# Patient Record
Sex: Female | Born: 1935 | Hispanic: No | Marital: Married | Smoking: Current every day smoker
Health system: Southern US, Community
[De-identification: ages and names within clinical notes are randomized; demographics above are authoritative.]

## PROBLEM LIST (undated history)

## (undated) DIAGNOSIS — N390 Urinary tract infection, site not specified: Secondary | ICD-10-CM

## (undated) DIAGNOSIS — E7800 Pure hypercholesterolemia, unspecified: Secondary | ICD-10-CM

## (undated) DIAGNOSIS — K259 Gastric ulcer, unspecified as acute or chronic, without hemorrhage or perforation: Secondary | ICD-10-CM

## (undated) DIAGNOSIS — I10 Essential (primary) hypertension: Secondary | ICD-10-CM

## (undated) DIAGNOSIS — Z9289 Personal history of other medical treatment: Secondary | ICD-10-CM

## (undated) DIAGNOSIS — I517 Cardiomegaly: Secondary | ICD-10-CM

## (undated) HISTORY — PX: NO PAST SURGERIES: SHX2092

## (undated) HISTORY — PX: ESOPHAGOGASTRODUODENOSCOPY ENDOSCOPY: SHX5814

---

## 1998-01-04 ENCOUNTER — Ambulatory Visit (HOSPITAL_COMMUNITY): Admission: RE | Admit: 1998-01-04 | Discharge: 1998-01-04 | Payer: Self-pay | Admitting: Family Medicine

## 1999-01-31 ENCOUNTER — Ambulatory Visit (HOSPITAL_COMMUNITY): Admission: RE | Admit: 1999-01-31 | Discharge: 1999-01-31 | Payer: Self-pay | Admitting: Family Medicine

## 2000-02-01 ENCOUNTER — Encounter: Payer: Self-pay | Admitting: Family Medicine

## 2000-02-01 ENCOUNTER — Ambulatory Visit (HOSPITAL_COMMUNITY): Admission: RE | Admit: 2000-02-01 | Discharge: 2000-02-01 | Payer: Self-pay | Admitting: Family Medicine

## 2007-09-16 ENCOUNTER — Inpatient Hospital Stay (HOSPITAL_COMMUNITY): Admission: EM | Admit: 2007-09-16 | Discharge: 2007-09-19 | Payer: Self-pay | Admitting: Emergency Medicine

## 2007-09-16 ENCOUNTER — Ambulatory Visit: Payer: Self-pay | Admitting: Family Medicine

## 2011-03-06 NOTE — Discharge Summary (Signed)
[name], [name] NO.:  [id number]   MEDICAL RECORD NO.:  [id number]          PATIENT TYPE:  INP   LOCATION:  4729                         FACILITY:  [facility]   PHYSICIAN:  Nestor Ramp, MD        DATE OF BIRTH:  [birth year]   DATE OF ADMISSION:  09/16/2007  DATE OF DISCHARGE:  09/19/2007                               DISCHARGE SUMMARY   PRIMARY CARE PHYSICIAN:  Dr. Windle Guard at [facility].   REASON FOR ADMISSION:  Weakness.   DISCHARGE DIAGNOSES:  1. Pyelonephritis, bilaterally.  2. Hyponatremia.  3. Anemia.  4. Hypertension.  5. Hyperlipidemia.  6. Thyroid nodule.   DISCHARGE MEDICATIONS:  1. Lipitor 10 mg p.o. daily.  2. Atenolol 100 mg p.o. daily.  3. Ciprofloxacin 500 mg 1 tablet p.o. b.i.d. x7 days.   Patient is to stop taking potassium and hydrochlorothiazide until she is  seen by Dr. Jeannetta Nap.  These medications were stopped because patient had  a normal potassium level on the day of discharge and continued  hyponatremia, which may be exacerbated by hydrochlorothiazide.   HOSPITAL COURSE:  Patient is a 77 year old female who presented with a  12-day history of nausea, vomiting and progressive weakness, found to  have nausea, vomiting and progressive weakness, found to have evidence  of urinary tract infection on urinalysis, as well as tachycardia and  hypotension.  Patient also had right CVA tenderness.   1. Pyelonephritis:  After obtaining urinalysis and urine culture,      patient was started on Rocephin 1 gram IV q.24 hours.  A CT scan of      the chest, abdomen and pelvis showed no evidence of abscess or      obstruction.  Patient's urine culture grew out E. Coli, which was      penicillin-sensitive and she was transitioned to Ciprofloxacin 500      mg p.o. b.i.d. prior to discharge.  She will need to complete a 10-      day course of antibiotics for her pyelonephritis.  By the day of      discharge, her vital signs  were stable.  She had been afebrile      greater than 24 hours and she had clinically improved.  Patient had      required brief placement of a Foley secondary to a postvoid      residual of 900 mL; however, she successfully passed a voiding      trial on the day prior to discharge and did not require any further      catheterization.  2. Anemia:  On admission, patient was found to have a hemoglobin of      8.9.  She was thought to be very hemoconcentrated secondary to      dehydration and so with fluid resuscitation, her repeat CBC was      significant for a hemoglobin of 6.2.  Patient was typed and crossed      for 2 units of blood.  She received the first unit without  any      difficulty; however, with the second unit she had an elevated      temperature and 19 beats of asymptomatic ventricular tachycardiac.      The second transfusion was stopped and she did not have any further      complications.  We were unable to complete iron studies of      significance secondary to patient receiving the blood transfusion      prior to those studies being drawn.  Patient's hemoglobin      stabilized at 8.9 prior to discharge.  She will need further workup      of this problem as an outpatient.  We recommend obtaining normal      anemia panel studies, as well as colonoscopy as an outpatient.  3. Hyponatremia:  Upon presentation, patient had a sodium level of      123.  Throughout the course of her hospital stay, this did trend up      slightly with administration of normal saline.  However, it never      reached a normal level and was 128 on the day of discharge.      Patient is asymptomatic.  Therefore, continued observation of this      problem can be observed as an outpatient.  Her HCTZ is being held      upon discharge; it was never started while in the hospital.  We      will leave restarting this medication up to the discretion of Dr.      Jeannetta Nap.  4. Hypertension:  Initially, patient was  hypotensive in the hospital.      However, with volume resuscitation her blood pressures normalized.      With her episode of ventricular tachycardia, her Atenolol was      restarted.  Patient remained normotensive throughout the duration      of her hospital stay.  Again, her HCTZ is being held secondary to      her hyponatremia.  It may be restarted by Dr. Jeannetta Nap at his      discretion.  5. Hypophosphatemia:  After patient's episode of ventricular      tachycardia, she received multiple laboratory studies, including      the phosphorus level.  This was found to be low at 1.4.  Patient      was given K-Phos 250 mg p.o. a.c. and h.s. to treat her hospital      phosphatemia.   CONDITION ON DISCHARGE:  Stable.   DISCHARGE LABS:  White blood cell count 16.4, hemoglobin 8.9, hematocrit  26.3, MCV 90.2, platelets 330, sodium 128, potassium 3.5, glucose 103,  BUN 4, creatinine 0.58, calcium 7.6, albumin 2.3, phosphorus 1.4,  magnesium 1.7.   DISPOSITION:  Patient is discharged to home.   DISCHARGE FOLLOW UP:  Patient is to follow up with Dr. Jeannetta Nap early next  week.  Patient will need to call and schedule an appointment.   FOLLOW UP ISSUES:  1. Hyponatremia.  2. Hypophosphatemia.  3. Anemia.  4. On CT scan, the patient was found to have a tiny left thyroid      nodule and thyroid ultrasound was recommended.      Lauro Franklin, MD  Electronically Signed      Nestor Ramp, MD  Electronically Signed    TCB/MEDQ  D:  09/19/2007  T:  09/19/2007  Job:  [id number]

## 2011-03-06 NOTE — H&P (Signed)
[name], [name] NO.:  [id number]   MEDICAL RECORD NO.:  [id number]          PATIENT TYPE:  INP   LOCATION:  4729                         FACILITY:  [facility]   PHYSICIAN:  Nestor Ramp, MD        DATE OF BIRTH:  1930/08/18   DATE OF ADMISSION:  09/16/2007  DATE OF DISCHARGE:                              HISTORY & PHYSICAL   PRIMARY CARE PHYSICIAN:  Dr. Jeannetta Martin at [facility].   CHIEF COMPLAINT:  Weakness.   HISTORY OF PRESENT ILLNESS:  This is a 77 year old [place] female with  a history of hypertension and hyperlipidemia.  Her husband reports that  she started 12 days ago with a sinus headache.  She has had some  intermittent nausea and vomiting for a period that lasted approximately  3 to 4 days.  She was seen by her primary care physician last week and  prescribed Phenergan as needed for nausea.  She says that she has taken  Phenergan approximately 3 to 4 times during the past week.  She has not  vomited now for about 6 days.  She is currently tolerating oral intake;  however, her husband reports progressive weakness.  She was so weak this  morning that she was unable to stand up.  In the emergency department,  she was tachycardic, slightly hypotensive and there was evidence of a  UTI on her urinalysis.   REVIEW OF SYSTEMS:  Pertinent for low back pain.  The patient denies  fevers, chills, dysuria, abdominal pain and headache.  The rest of the  review of systems is per HPI.   PAST MEDICAL HISTORY:  1. Hypertension,  2. Hyperlipidemia.   PAST SURGICAL HISTORY:  The patient has no history of any surgeries.   SOCIAL HISTORY:  She is originally from [place].  She has been married, to  her husband, for over 40 years.  She currently has 2 grown children and  several grandchildren.  She admits to occasional tobacco use, but denies  any regular routine alcohol use.   FAMILY HISTORY:  Not contributory in this 77 year old female.   PHYSICAL  EXAMINATION:  VITAL SIGNS:  Temperature 98.3.  Blood pressure  106/64.  Heart rate 104.  Respirations 20.  GENERAL:  The patient is in no acute distress.  She is laying on the  stretcher.  She is alert and oriented.  NECK:  There was no JVD.  No thyromegaly.  HEENT:  Mucous membranes are very mildly dry.  Pharynx is not  erythematous.  CARDIOVASCULAR:  Regular rate and rhythm without murmur.  LUNGS:  Clear to auscultation bilaterally.  Normal respiratory effort.  ABDOMEN:  Bowel sounds are active.  Abdomen is soft, nontender,  nondistended.  There are no masses and there is right costovertebral  angle tenderness.  EXTREMITIES:  Warm and well perfuse.  There is no edema.   LABORATORY DATA:  Sodium 123, potassium 2.7, chloride 83, bicarb 28,  creatinine 0.77, BUN 30, glucose 163.  Liver enzymes are normal.  Calcium is 7.9 and albumin is 2.6.  The patient's white count is 26, her  hemoglobin is 8.9, platelets are 361.  Urinalysis is significant for a  moderate leukocyte esterase, nitrites are negative.  There are 21 to 50  white blood cells and many bacteria on microscopic analysis.   ASSESSMENT:  This is a 77 year old female with a history of hypertension  and hyperlipidemia.  She presents with a 12 day history of intermittent  nausea, vomiting and progressive weakness.  She has evidence of  pyelonephritis, as well as volume depletion and hyponatremia in the  emergency department.   1. Pyelonephritis:  We will continue treatment with Rocephin and      follow urine cultures as available.  2. Volume depletion:  She is status post 1 liter bolus x2 in the      emergency department.  We will continue normal sinus rhythm at 100      mL per hour.  3. Nausea and vomiting:  Likely secondary to pyelonephritis.  We will      begin Phenergan as needed.  4. Hyponatremia:  Hypovolemic.  We will watch the trend in sodium      after giving normal saline fluids.  5. Hypertension:  Her blood pressure  is now borderline.  We will hold      her home blood pressure medications for now.  6. Hyperlipidemia:  We will resume her home statin dose while in the      hospital.      Sylvan Cheese, M.D.  Electronically Signed      Nestor Ramp, MD  Electronically Signed    MJ/MEDQ  D:  09/16/2007  T:  09/17/2007  Job:  [id number]

## 2011-06-24 ENCOUNTER — Emergency Department (HOSPITAL_COMMUNITY): Payer: Medicare Other

## 2011-06-24 ENCOUNTER — Emergency Department (HOSPITAL_COMMUNITY)
Admission: EM | Admit: 2011-06-24 | Discharge: 2011-06-24 | Disposition: A | Discharge status: Home / Self Care | Payer: Medicare Other | Attending: Emergency Medicine | Admitting: Emergency Medicine

## 2011-06-24 DIAGNOSIS — I10 Essential (primary) hypertension: Secondary | ICD-10-CM | POA: Insufficient documentation

## 2011-06-24 DIAGNOSIS — R1013 Epigastric pain: Secondary | ICD-10-CM | POA: Insufficient documentation

## 2011-06-24 DIAGNOSIS — E860 Dehydration: Secondary | ICD-10-CM | POA: Insufficient documentation

## 2011-06-24 DIAGNOSIS — E876 Hypokalemia: Secondary | ICD-10-CM | POA: Insufficient documentation

## 2011-06-24 DIAGNOSIS — I4510 Unspecified right bundle-branch block: Secondary | ICD-10-CM | POA: Insufficient documentation

## 2011-06-24 DIAGNOSIS — R110 Nausea: Secondary | ICD-10-CM | POA: Insufficient documentation

## 2011-06-24 DIAGNOSIS — N390 Urinary tract infection, site not specified: Secondary | ICD-10-CM | POA: Insufficient documentation

## 2011-06-24 DIAGNOSIS — E7800 Pure hypercholesterolemia, unspecified: Secondary | ICD-10-CM | POA: Insufficient documentation

## 2011-06-24 LAB — DIFFERENTIAL
Eosinophils Relative: 0 % (ref 0–5)
Lymphocytes Relative: 9 % — ABNORMAL LOW (ref 12–46)
Monocytes Absolute: 0.8 10*3/uL (ref 0.1–1.0)
Monocytes Relative: 6 % (ref 3–12)
Neutro Abs: 11.3 10*3/uL — ABNORMAL HIGH (ref 1.7–7.7)

## 2011-06-24 LAB — CBC
HCT: 30.1 % — ABNORMAL LOW (ref 36.0–46.0)
Hemoglobin: 10.2 g/dL — ABNORMAL LOW (ref 12.0–15.0)
MCH: 28 pg (ref 26.0–34.0)
MCHC: 33.9 g/dL (ref 30.0–36.0)
RDW: 12.5 % (ref 11.5–15.5)

## 2011-06-24 LAB — URINALYSIS, ROUTINE W REFLEX MICROSCOPIC
Bilirubin Urine: NEGATIVE
Nitrite: POSITIVE — AB
Specific Gravity, Urine: 1.021 (ref 1.005–1.030)
Urobilinogen, UA: 0.2 mg/dL (ref 0.0–1.0)

## 2011-06-24 LAB — URINE MICROSCOPIC-ADD ON

## 2011-06-24 LAB — COMPREHENSIVE METABOLIC PANEL
BUN: 41 mg/dL — ABNORMAL HIGH (ref 6–23)
Calcium: 9.1 mg/dL (ref 8.4–10.5)
Creatinine, Ser: 0.98 mg/dL (ref 0.50–1.10)
GFR calc Af Amer: >60 mL/min (ref >60)
Glucose, Bld: 122 mg/dL — ABNORMAL HIGH (ref 70–99)
Total Protein: 7.6 g/dL (ref 6.0–8.3)

## 2011-06-24 LAB — GLUCOSE, CAPILLARY: Glucose-Capillary: 132 mg/dL — ABNORMAL HIGH (ref 70–99)

## 2011-06-24 LAB — LIPASE, BLOOD: Lipase: 28 U/L (ref 11–59)

## 2011-06-26 LAB — URINE CULTURE

## 2011-07-31 LAB — URINE MICROSCOPIC-ADD ON

## 2011-07-31 LAB — URINALYSIS, MICROSCOPIC ONLY
Glucose, UA: NEGATIVE
Nitrite: NEGATIVE
Protein, ur: NEGATIVE
Urobilinogen, UA: 1

## 2011-07-31 LAB — CROSSMATCH: ABO/RH(D): O POS

## 2011-07-31 LAB — COMPREHENSIVE METABOLIC PANEL
ALT: 35
AST: 25
Alkaline Phosphatase: 56
BUN: 4 — ABNORMAL LOW
CO2: 28
Calcium: 7.9 — ABNORMAL LOW
Chloride: 96
Creatinine, Ser: 0.58
GFR calc Af Amer: >60
GFR calc non Af Amer: >60
Glucose, Bld: 103 — ABNORMAL HIGH
Potassium: 3.5
Sodium: 123 — ABNORMAL LOW
Total Bilirubin: 0.9
Total Protein: 5.3 — ABNORMAL LOW
Total Protein: 5.9 — ABNORMAL LOW

## 2011-07-31 LAB — BASIC METABOLIC PANEL
BUN: 5 — ABNORMAL LOW
BUN: 6
CO2: 24
CO2: 25
Calcium: 7.6 — ABNORMAL LOW
Chloride: 96
Chloride: 97
Chloride: 97
Chloride: 99
Creatinine, Ser: 0.46
Creatinine, Ser: 0.48
Creatinine, Ser: 0.59
GFR calc Af Amer: >60
GFR calc Af Amer: >60
GFR calc Af Amer: >60
GFR calc non Af Amer: >60
Potassium: 3.2 — ABNORMAL LOW
Potassium: 3.8
Potassium: 3.9
Sodium: 130 — ABNORMAL LOW

## 2011-07-31 LAB — CBC
HCT: 18 — ABNORMAL LOW
HCT: 23.2 — ABNORMAL LOW
HCT: 26.3 — ABNORMAL LOW
Hemoglobin: 8 — ABNORMAL LOW
Hemoglobin: 8.9 — ABNORMAL LOW
MCHC: 33.9
MCHC: 34.3
MCHC: 34.7
MCV: 87.5
MCV: 88.1
MCV: 90.2
MCV: 90.7
Platelets: 257
Platelets: 269
Platelets: 330
RBC: 2.06 — ABNORMAL LOW
RBC: 2.73 — ABNORMAL LOW
RBC: 2.98 — ABNORMAL LOW
RDW: 13.3
RDW: 13.4
RDW: 13.5
WBC: 14.7 — ABNORMAL HIGH

## 2011-07-31 LAB — URINALYSIS, ROUTINE W REFLEX MICROSCOPIC
Nitrite: NEGATIVE
Specific Gravity, Urine: 1.014
pH: 5.5

## 2011-07-31 LAB — CULTURE, BLOOD (ROUTINE X 2)
Culture: NO GROWTH
Culture: NO GROWTH

## 2011-07-31 LAB — URINE CULTURE
Colony Count: NO GROWTH
Culture: NO GROWTH
Special Requests: POSITIVE

## 2011-07-31 LAB — MAGNESIUM: Magnesium: 1.7

## 2011-07-31 LAB — GRAM STAIN: Gram Stain: NONE SEEN

## 2011-07-31 LAB — IRON AND TIBC
Iron: 47
TIBC: 163 — ABNORMAL LOW

## 2011-07-31 LAB — ABO/RH: ABO/RH(D): O POS

## 2011-07-31 LAB — FERRITIN: Ferritin: 310 — ABNORMAL HIGH (ref 10–291)

## 2011-07-31 LAB — VITAMIN D 25 HYDROXY (VIT D DEFICIENCY, FRACTURES): Vit D, 25-Hydroxy: 32 (ref 30–89)

## 2011-07-31 LAB — POCT CARDIAC MARKERS: Troponin i, poc: <0.05

## 2013-02-17 ENCOUNTER — Other Ambulatory Visit: Payer: Self-pay

## 2013-02-17 ENCOUNTER — Observation Stay (HOSPITAL_COMMUNITY)
Admission: EM | Admit: 2013-02-17 | Discharge: 2013-02-18 | Disposition: A | Discharge status: Home / Self Care | Payer: Medicare Other | Attending: Internal Medicine | Admitting: Internal Medicine

## 2013-02-17 ENCOUNTER — Emergency Department (HOSPITAL_COMMUNITY): Payer: Medicare Other

## 2013-02-17 ENCOUNTER — Encounter (HOSPITAL_COMMUNITY): Payer: Self-pay | Admitting: *Deleted

## 2013-02-17 DIAGNOSIS — E785 Hyperlipidemia, unspecified: Secondary | ICD-10-CM | POA: Insufficient documentation

## 2013-02-17 DIAGNOSIS — I10 Essential (primary) hypertension: Secondary | ICD-10-CM

## 2013-02-17 DIAGNOSIS — R5381 Other malaise: Secondary | ICD-10-CM | POA: Insufficient documentation

## 2013-02-17 DIAGNOSIS — R5383 Other fatigue: Secondary | ICD-10-CM | POA: Insufficient documentation

## 2013-02-17 DIAGNOSIS — N289 Disorder of kidney and ureter, unspecified: Secondary | ICD-10-CM

## 2013-02-17 DIAGNOSIS — R112 Nausea with vomiting, unspecified: Secondary | ICD-10-CM | POA: Insufficient documentation

## 2013-02-17 DIAGNOSIS — E861 Hypovolemia: Secondary | ICD-10-CM | POA: Insufficient documentation

## 2013-02-17 DIAGNOSIS — E7800 Pure hypercholesterolemia, unspecified: Secondary | ICD-10-CM | POA: Insufficient documentation

## 2013-02-17 DIAGNOSIS — E876 Hypokalemia: Principal | ICD-10-CM

## 2013-02-17 DIAGNOSIS — R82998 Other abnormal findings in urine: Secondary | ICD-10-CM | POA: Insufficient documentation

## 2013-02-17 DIAGNOSIS — E782 Mixed hyperlipidemia: Secondary | ICD-10-CM

## 2013-02-17 DIAGNOSIS — E860 Dehydration: Secondary | ICD-10-CM

## 2013-02-17 DIAGNOSIS — R630 Anorexia: Secondary | ICD-10-CM | POA: Insufficient documentation

## 2013-02-17 DIAGNOSIS — E871 Hypo-osmolality and hyponatremia: Secondary | ICD-10-CM | POA: Insufficient documentation

## 2013-02-17 DIAGNOSIS — N390 Urinary tract infection, site not specified: Secondary | ICD-10-CM

## 2013-02-17 HISTORY — DX: Cardiomegaly: I51.7

## 2013-02-17 HISTORY — DX: Pure hypercholesterolemia, unspecified: E78.00

## 2013-02-17 HISTORY — DX: Essential (primary) hypertension: I10

## 2013-02-17 LAB — BASIC METABOLIC PANEL
BUN: 34 mg/dL — ABNORMAL HIGH (ref 6–23)
CO2: 29 mEq/L (ref 19–32)
CO2: 27 mEq/L (ref 19–32)
Chloride: 99 mEq/L (ref 96–112)
GFR calc Af Amer: 64 mL/min — ABNORMAL LOW (ref >90)
GFR calc non Af Amer: 53 mL/min — ABNORMAL LOW (ref >90)
GFR calc non Af Amer: 55 mL/min — ABNORMAL LOW (ref >90)
Glucose, Bld: 106 mg/dL — ABNORMAL HIGH (ref 70–99)
Glucose, Bld: 113 mg/dL — ABNORMAL HIGH (ref 70–99)
Potassium: 2.5 mEq/L — CL (ref 3.5–5.1)
Potassium: 2.9 mEq/L — ABNORMAL LOW (ref 3.5–5.1)
Sodium: 134 mEq/L — ABNORMAL LOW (ref 135–145)
Sodium: 133 mEq/L — ABNORMAL LOW (ref 135–145)

## 2013-02-17 LAB — URINE MICROSCOPIC-ADD ON

## 2013-02-17 LAB — URINALYSIS, ROUTINE W REFLEX MICROSCOPIC
Bilirubin Urine: NEGATIVE
Ketones, ur: NEGATIVE mg/dL
Nitrite: NEGATIVE
Protein, ur: 30 mg/dL — AB
pH: 6 (ref 5.0–8.0)

## 2013-02-17 LAB — COMPREHENSIVE METABOLIC PANEL
ALT: 14 U/L (ref 0–35)
Alkaline Phosphatase: 77 U/L (ref 39–117)
CO2: 29 mEq/L (ref 19–32)
Chloride: 86 mEq/L — ABNORMAL LOW (ref 96–112)
GFR calc Af Amer: 54 mL/min — ABNORMAL LOW (ref >90)
GFR calc non Af Amer: 47 mL/min — ABNORMAL LOW (ref >90)
Glucose, Bld: 120 mg/dL — ABNORMAL HIGH (ref 70–99)
Potassium: 2.7 mEq/L — CL (ref 3.5–5.1)
Sodium: 130 mEq/L — ABNORMAL LOW (ref 135–145)
Total Bilirubin: 1 mg/dL (ref 0.3–1.2)

## 2013-02-17 LAB — CBC WITH DIFFERENTIAL/PLATELET
Hemoglobin: 16.5 g/dL — ABNORMAL HIGH (ref 12.0–15.0)
Lymphocytes Relative: 13 % (ref 12–46)
Lymphs Abs: 1.5 10*3/uL (ref 0.7–4.0)
Monocytes Relative: 10 % (ref 3–12)
Neutrophils Relative %: 77 % (ref 43–77)
Platelets: 223 10*3/uL (ref 150–400)
RBC: 5.38 MIL/uL — ABNORMAL HIGH (ref 3.87–5.11)
WBC: 11.8 10*3/uL — ABNORMAL HIGH (ref 4.0–10.5)

## 2013-02-17 LAB — POCT I-STAT, CHEM 8
BUN: 42 mg/dL — ABNORMAL HIGH (ref 6–23)
Chloride: 93 mEq/L — ABNORMAL LOW (ref 96–112)
Creatinine, Ser: 1.2 mg/dL — ABNORMAL HIGH (ref 0.50–1.10)
Sodium: 134 mEq/L — ABNORMAL LOW (ref 135–145)
TCO2: 30 mmol/L (ref 0–100)

## 2013-02-17 LAB — POCT I-STAT TROPONIN I

## 2013-02-17 MED ORDER — DEXTROSE 5 % IV SOLN: 1.0000 g | Freq: Once | INTRAVENOUS | Status: DC

## 2013-02-17 MED ORDER — ASPIRIN EC 81 MG PO TBEC
81.0000 mg | DELAYED_RELEASE_TABLET | Freq: Every day | ORAL | Status: DC
  Administered 2013-02-17 – 2013-02-18 (×2): 81 mg via ORAL
  Filled 2013-02-17 (×2): qty 1

## 2013-02-17 MED ORDER — SODIUM CHLORIDE 0.9 % IV BOLUS (SEPSIS)
1000.0000 mL | Freq: Once | INTRAVENOUS | Status: AC
  Administered 2013-02-17: 1000 mL via INTRAVENOUS

## 2013-02-17 MED ORDER — SODIUM CHLORIDE 0.9 % IJ SOLN: 3.0000 mL | Freq: Two times a day (BID) | INTRAMUSCULAR | Status: DC

## 2013-02-17 MED ORDER — ONDANSETRON HCL 4 MG PO TABS: 4.0000 mg | ORAL_TABLET | Freq: Four times a day (QID) | ORAL | Status: DC | PRN

## 2013-02-17 MED ORDER — POTASSIUM CHLORIDE CRYS ER 20 MEQ PO TBCR
40.0000 meq | EXTENDED_RELEASE_TABLET | Freq: Once | ORAL | Status: AC
  Administered 2013-02-17: 40 meq via ORAL
  Filled 2013-02-17: qty 2

## 2013-02-17 MED ORDER — POTASSIUM CHLORIDE CRYS ER 20 MEQ PO TBCR
40.0000 meq | EXTENDED_RELEASE_TABLET | Freq: Four times a day (QID) | ORAL | Status: AC
  Administered 2013-02-17 (×2): 40 meq via ORAL
  Filled 2013-02-17 (×2): qty 2

## 2013-02-17 MED ORDER — ONDANSETRON HCL 4 MG/2ML IJ SOLN: 4.0000 mg | Freq: Four times a day (QID) | INTRAMUSCULAR | Status: DC | PRN

## 2013-02-17 MED ORDER — ENOXAPARIN SODIUM 40 MG/0.4ML ~~LOC~~ SOLN
40.0000 mg | SUBCUTANEOUS | Status: DC
  Administered 2013-02-17: 40 mg via SUBCUTANEOUS
  Filled 2013-02-17 (×2): qty 0.4

## 2013-02-17 MED ORDER — SIMVASTATIN 20 MG PO TABS
20.0000 mg | ORAL_TABLET | Freq: Every day | ORAL | Status: DC
  Administered 2013-02-17: 20 mg via ORAL
  Filled 2013-02-17 (×2): qty 1

## 2013-02-17 MED ORDER — POTASSIUM CHLORIDE IN NACL 40-0.9 MEQ/L-% IV SOLN
INTRAVENOUS | Status: DC
  Administered 2013-02-17 – 2013-02-18 (×2): via INTRAVENOUS
  Filled 2013-02-17 (×4): qty 1000

## 2013-02-17 MED ORDER — MAGNESIUM SULFATE 40 MG/ML IJ SOLN
2.0000 g | Freq: Once | INTRAMUSCULAR | Status: AC
  Administered 2013-02-17: 2 g via INTRAVENOUS
  Filled 2013-02-17: qty 50

## 2013-02-17 MED ORDER — POTASSIUM CHLORIDE CRYS ER 20 MEQ PO TBCR
20.0000 meq | EXTENDED_RELEASE_TABLET | Freq: Once | ORAL | Status: AC
  Administered 2013-02-17: 20 meq via ORAL
  Filled 2013-02-17: qty 1

## 2013-02-17 MED ORDER — THIAMINE HCL 100 MG/ML IJ SOLN
100.0000 mg | Freq: Once | INTRAMUSCULAR | Status: AC
  Administered 2013-02-17: 100 mg via INTRAVENOUS
  Filled 2013-02-17: qty 2

## 2013-02-17 MED ORDER — POTASSIUM CHLORIDE CRYS ER 20 MEQ PO TBCR: 40.0000 meq | EXTENDED_RELEASE_TABLET | Freq: Once | ORAL | Status: DC

## 2013-02-17 NOTE — ED Notes (Signed)
Pt here with complaints of weakness that has persisted x 4 days.  Pt advises she thinks she has a UTI due to having the same symptoms of last UTI.

## 2013-02-17 NOTE — ED Provider Notes (Signed)
History     CSN: [id number]  Arrival date & time 02/17/13  0901   First MD Initiated Contact with Patient 02/17/13 [phone]      Chief Complaint  Patient presents with  . Fatigue    (Consider location/radiation/quality/duration/timing/severity/associated sxs/prior treatment) HPI Comments: [name] is an 79 y/o F with PMHx of HTN and hypercholesterolemia presenting to the ED with fatigue x 5 days. Patient reported that approximately 6 days ago she had what she thought to be a stomach virus - reported having at least 12 episodes of emesis mainly of food and liquid contents - stated that she has not had an episode of emesis since that day. Patient reported that since then she has been rather fatigued and has had decreased appetite. Stated that she has been mainly drinking water and orange juice, eating some soup every once in a while, but does not have an appetite for a full meal. Stated that she gets tired very easily when walking, feels leg heaviness. Stated that she is sleeping more than half the day and continues to be tired. Reported intermittent episodes of nausea throughout the day. Reported constipation, no BM for the past 2 days, when normally she has at least one BM per day. Denied confusion, altered mental status, decreased concentration, headaches, dizziness, visual distortions, eye complaints, diarrhea, ear complaints, fever, chills, shortness of breathe, chest pain, dysphagia, odynphagia, neck pain, back pain, urinary symptoms.    PCP: Windle Guard  The history is provided by the patient and the spouse. No language interpreter was used.    Past Medical History  Diagnosis Date  . Hypertension   . High cholesterol     History reviewed. No pertinent past surgical history.  No family history on file.  History  Substance Use Topics  . Smoking status: Current Every Day Smoker -- 0.50 packs/day  . Smokeless tobacco: Not on file  . Alcohol Use: No    OB History   Grav Para  Term Preterm Abortions TAB SAB Ect Mult Living                  Review of Systems  Constitutional: Positive for appetite change and fatigue. Negative for fever, chills and diaphoresis.       Decreased appetite  HENT: Negative for ear pain, congestion, sore throat, rhinorrhea, trouble swallowing, neck pain, neck stiffness and tinnitus.   Eyes: Negative for pain and visual disturbance.  Respiratory: Negative for cough, chest tightness and shortness of breath.   Cardiovascular: Negative for chest pain.  Gastrointestinal: Positive for vomiting and constipation. Negative for nausea, abdominal pain and diarrhea.  Genitourinary: Negative for dysuria, hematuria, decreased urine volume and difficulty urinating.  Musculoskeletal: Negative for back pain, joint swelling and arthralgias.  Skin: Negative for rash.  Neurological: Negative for dizziness, weakness, light-headedness, numbness and headaches.  All other systems reviewed and are negative.    Allergies  Review of patient's allergies indicates no known allergies.  Home Medications   Current Outpatient Rx  Name  Route  Sig  Dispense  Refill  . aspirin EC 81 MG tablet   Oral   Take 81 mg by mouth daily.         Marland Kitchen atenolol (TENORMIN) 100 MG tablet   Oral   Take 100 mg by mouth daily.         . pravastatin (PRAVACHOL) 40 MG tablet   Oral   Take 40 mg by mouth daily.         Marland Kitchen  triamterene-hydrochlorothiazide (MAXZIDE) 75-50 MG per tablet   Oral   Take 0.5 tablets by mouth daily.           BP 125/51  Pulse 60  Temp(Src) 97.9 F (36.6 C) (Oral)  Resp 21  SpO2 94%  Physical Exam  Nursing note and vitals reviewed. Constitutional: She is oriented to person, place, and time. She appears well-developed and well-nourished. No distress.  HENT:  Head: Normocephalic and atraumatic.  Mouth/Throat: No oropharyngeal exudate.  Uvula midline, symmetrical elevation  Mouth: Lips dry and cracked. Mucus membranes dry.   Eyes:  Conjunctivae and EOM are normal. Pupils are equal, round, and reactive to light. Right eye exhibits no discharge. Left eye exhibits no discharge.  Neck: Normal range of motion. Neck supple. No tracheal deviation present.  Negative nuchal rigidity Negative lymphadenopathy  Cardiovascular: Normal rate, regular rhythm and normal heart sounds.  Exam reveals no friction rub.   No murmur heard. Radial pulses 2+ bilaterally Pedal pulses 2+ bilaterally Negative ankle and leg swelling Negative pitting edema  Pulmonary/Chest: Effort normal and breath sounds normal. No respiratory distress. She has no wheezes. She has no rales. She exhibits no tenderness.  Abdominal: Soft. Bowel sounds are normal. She exhibits no distension and no mass. There is no tenderness. There is no rebound and no guarding.  Musculoskeletal: Normal range of motion. She exhibits no edema and no tenderness.  Full ROM to upper and lower extremities bilaterally Strength 5+/5+ to upper and lower extremities bilaterally  Lymphadenopathy:    She has no cervical adenopathy.  Neurological: She is alert and oriented to person, place, and time. No cranial nerve deficit. She exhibits normal muscle tone. Coordination normal.  Skin: Skin is warm and dry. No rash noted. She is not diaphoretic. No erythema.  Psychiatric: She has a normal mood and affect. Her behavior is normal. Thought content normal.    ED Course  Procedures (including critical care time)   10:39AM discussed case with Dr. Ethelda Chick - Dr. Ethelda Chick to see the patient. EKG and troponin to r/o cardiac affect, chest xray, UA to r/o UTI, CBC and CMP ordered IV saline lock placed - NS IV fluids 1L ordered  10:50AM Dr. Ethelda Chick recommended thiamine 100mg /mL IV - ordered.  Hydrate patient. Going to have patient eat while in ED setting.   11:59AM labs reviewed. CBC - WBC elevated (11.8), RBC elevated (5.38), and Hgb elevated (16.5) - trend seen in patient.  CMP hyponatremia  (130), hypokalemia (2.7 - hemolysis) - potassium 40 mEq PO and re-ordered re-draw for potassium level, chloride low (86); BUN elevated (45) trend seen in patient - possible pre-renal disorder, serum Cr within normal limits (1.08). Chest xray - no acute findings.  UA small Hgb, moderate amount of leukocytes, many bacteria, WBC elevated 11-20, cloudy appearance EKG negative for NSTEMI/STEMI - inverted T waves noted in leads V1-V5 and mild ST segment    Date: 02/17/2013  Rate: 59 - mild bradycardic  Rhythm: normal sinus rhythm  QRS Axis: left  Intervals: normal borderline prolonged QT intervals  ST/T Wave abnormalities: nonspecific ST/T changes inverted T waves in leads V1, V2, V3, V4, V5. Mild ST segment depression in leads V5 and V6  Conduction Disutrbances:none  Narrative Interpretation:   Old EKG Reviewed: changes noted   1:24PM re-draw of potassium levels - 2.3 - hypokalemia - patient needs to be admitted.  Ceftriaxone 1 gram IV ordered for UTI control  1:44PM discussed findings with patient at bedside. Discussed with patient that when  she is released from the hospital she needs to follow-up with PCP regarding elevated BUN and Cr levels - possible beginnings of renal disorder. Patient will most likely be admitted to the hospital - consult for internal medicine ordered.    2:37PM Patient admitted to Internal Medicine, Telemetry  Labs Reviewed  CBC WITH DIFFERENTIAL - Abnormal; Notable for the following:    WBC 11.8 (*)    RBC 5.38 (*)    Hemoglobin 16.5 (*)    MCHC 36.9 (*)    Neutro Abs 9.1 (*)    Monocytes Absolute 1.2 (*)    All other components within normal limits  COMPREHENSIVE METABOLIC PANEL - Abnormal; Notable for the following:    Sodium 130 (*)    Potassium 2.7 (*)    Chloride 86 (*)    Glucose, Bld 120 (*)    BUN 45 (*)    Total Protein 8.4 (*)    GFR calc non Af Amer 47 (*)    GFR calc Af Amer 54 (*)    All other components within normal limits  URINALYSIS,  ROUTINE W REFLEX MICROSCOPIC - Abnormal; Notable for the following:    APPearance CLOUDY (*)    Hgb urine dipstick SMALL (*)    Protein, ur 30 (*)    Leukocytes, UA MODERATE (*)    All other components within normal limits  URINE MICROSCOPIC-ADD ON - Abnormal; Notable for the following:    Bacteria, UA MANY (*)    Casts HYALINE CASTS (*)    All other components within normal limits  POCT I-STAT, CHEM 8 - Abnormal; Notable for the following:    Sodium 134 (*)    Potassium 2.3 (*)    Chloride 93 (*)    BUN 42 (*)    Creatinine, Ser 1.20 (*)    Glucose, Bld 114 (*)    Calcium, Ion 1.05 (*)    All other components within normal limits  URINE CULTURE   Dg Chest 2 View  02/17/2013  *RADIOLOGY REPORT*  Clinical Data: Fatigue, hypertension.  CHEST - 2 VIEW  Comparison: 09/17/2007  Findings: Mild cardiomegaly.  No confluent airspace opacities or effusions.  No acute bony abnormality.  IMPRESSION: Cardiomegaly.  No acute findings.   Original Report Authenticated By: Charlett Nose, M.D.      1. Hypokalemia   2. Hyponatremia   3. Dehydration   4. UTI (urinary tract infection)   5. Renal disorder       MDM  I personally evaluated and examined the patient. Patient appeared calm and cooperative. Patient afebrile, normotensive, non-tachycardic, alert and oriented. Mild dry mucus membranes noted to mouth, possible dehydration, but otherwise unremarkable physical exam. Labs and imaging ordered. Chest xray cardiomegaly noted, negative findings for acute cardiopulmonary disease. CBC mild elevated WBC (11.8). CMP hyponatremia (130) hypokalemia (2.7) - initial potassium level was hemolyzed - second draw of potassium levels were low (2.3) - noted hypokalemia. Changes to EKG noted - mild inverted T waves noted to V1 to V5 and mild ST segment depressions noted to V5 and V6 - denies chest pain, shortness of breathe, difficulty breathing. Elevated BUN and Cr noted - possible beginnings of renal disorder -  brought this to the attention of the patient - important to follow-up with when discharged from hospital and following up with PCP. UA noted many bacteria, elevated WBC 11-20, cloudy appearance, moderate leukocytes - ceftriaxone 1 gram IV given for infection. Patient ate sandwich while in ED setting. Discussed case with Dr.  Alden Server - Dr. Ethelda Chick saw patient. Due to hypokalemia and changes to EKG noted Internal Medicine consulted. Patient admitted to Internal Medicine, telemetry, admitting physician Dr. Irine Seal.         Raymon Mutton, PA-C 02/17/13 [phone]

## 2013-02-17 NOTE — ED Provider Notes (Signed)
Medical screening examination/treatment/procedure(s) were conducted as a shared visit with non-physician practitioner(s) and myself.  I personally evaluated the patient during the encounter  Doug Sou, MD 02/17/13 1649

## 2013-02-17 NOTE — Progress Notes (Signed)
Paged Dr. Collier Bullock regarding K+ of 2.5.  No new orders at this time.  Patient receiving IVF with potassium and PO potassium q 6hr. MD will review labs and determine action.

## 2013-02-17 NOTE — H&P (Signed)
Hospital Admission Note Date: 02/17/2013  Patient name: [name] Medical record number: [id number] Date of birth: 03/23/1930 Age: 79 y.o. Gender: female PCP: No primary provider on file.  _________________________________________________________ INTERNAL MEDICINE TEACHING SERVICE CONTACT INFO     Weekday Hours (7AM-5PM): ** If no return call within 15 minutes (after trying both pagers listed below), please call after hours pagers.    First Contact:  Dr. Collier Bullock   Pager:  [phone] Second Contact:   Dr. Dorise Hiss   Pager:  [phone]      After Hours (after 5PM)/ Weekend / Holidays: First Contact:              Pager: [phone] Second Contact:         Pager: [phone] _________________________________________________________   Chief Complaint: Can't walk  History of Present Illness:  [name] is an 79 year old female with a history of high blood pressure and high cholesterol, who presents with nausea and vomiting that started on Friday. Patient states that she was vomiting all day on Friday, bilious, nonbloody, and continued to feel bad throughout the weekend. Her vomiting decreased some but she continued to have nausea and she has not eating anything all weekend. Her fluid intake has been minimal as well, just some water and juice here and there. Her husband brought her into the hospital today because this morning she was only able to walk a few steps without feeling extraordinarily fatigued. Her husband was concerned since she had not been eating or drinking very much. Patient endorses nausea but the vomiting has resolved. She states that she spent most of the past 3-4 days laying on the couch. She does not have any abdominal pain, no dysuria, no urinary frequency, no shortness of breath, no chest pain, and no diarrhea. Patient denies any melena, hematochezia.  She does state that people at her church have been sick with a GI illness recently. Her husband has not been sick. Patient did continue to  take her blood pressure medications while she was feeling ill this weekend.  Social history: Patient denies using any alcohol or drugs. She does smoke about 3 cigarettes per day.   Meds:   Medication List    ASK your doctor about these medications       aspirin EC 81 MG tablet  Take 81 mg by mouth daily.     atenolol 100 MG tablet  Commonly known as:  TENORMIN  Take 100 mg by mouth daily.     pravastatin 40 MG tablet  Commonly known as:  PRAVACHOL  Take 40 mg by mouth daily.     triamterene-hydrochlorothiazide 75-50 MG per tablet  Commonly known as:  MAXZIDE  Take 0.5 tablets by mouth daily.        Allergies: Allergies as of 02/17/2013  . (No Known Allergies)   Past Medical History  Diagnosis Date  . Hypertension   . High cholesterol    History reviewed. No pertinent past surgical history. No family history on file. History   Social History  . Marital Status: Married    Spouse Name: N/A    Number of Children: N/A  . Years of Education: N/A   Occupational History  . Not on file.   Social History Main Topics  . Smoking status: Current Every Day Smoker -- 0.50 packs/day  . Smokeless tobacco: Not on file  . Alcohol Use: No  . Drug Use: No  . Sexually Active: Not on file   Other Topics Concern  . Not  on file   Social History Narrative  . No narrative on file    Review of Systems: Pertinent items noted in HPI   Physical Exam Blood pressure 125/51, pulse 60, temperature 97.9 F (36.6 C), temperature source Oral, resp. rate 21, SpO2 94.00%. General:  No acute distress, alert and oriented x 3, well-appearing Asian female, appears younger than stated age HEENT:  PERRL, EOMI, moist mucous membranes, oropharynx clear, no lesions Cardiovascular:  Regular rate and rhythm, no murmurs, rubs or gallops Respiratory:  Clear to auscultation bilaterally, no wheezes, rales, or rhonchi Abdomen:  Soft, nondistended, nontender to palpation, bowel sounds  present Extremities:  Warm and well-perfused, no edema.  Skin: Warm, dry, no rashes Neuro: Cranial nerves intact, no facial droop, tongue and uvula midline, strength is 5 out of 5 both upper and lower extremities, symmetric. Sensation intact throughout. Gait testing deferred.  Lab results: Basic Metabolic Panel:  Recent Labs  16/10/96 1011 02/17/13 1317  NA 130* 134*  K 2.7* 2.3*  CL 86* 93*  CO2 29  --   GLUCOSE 120* 114*  BUN 45* 42*  CREATININE 1.08 1.20*  CALCIUM 10.1  --    Liver Function Tests:  Recent Labs  02/17/13 1011  AST 27  ALT 14  ALKPHOS 77  BILITOT 1.0  PROT 8.4*  ALBUMIN 3.9   No results found for this basename: LIPASE, AMYLASE,  in the last 72 hours No results found for this basename: AMMONIA,  in the last 72 hours CBC:  Recent Labs  02/17/13 1011 02/17/13 1317  WBC 11.8*  --   NEUTROABS 9.1*  --   HGB 16.5* 13.6  HCT 44.7 40.0  MCV 83.1  --   PLT 223  --    Urinalysis:  Recent Labs  02/17/13 1133  COLORURINE YELLOW  LABSPEC 1.021  PHURINE 6.0  GLUCOSEU NEGATIVE  HGBUR SMALL*  BILIRUBINUR NEGATIVE  KETONESUR NEGATIVE  PROTEINUR 30*  UROBILINOGEN 0.2  NITRITE NEGATIVE  LEUKOCYTESUR MODERATE*     Imaging results:  Dg Chest 2 View  02/17/2013  *RADIOLOGY REPORT*  Clinical Data: Fatigue, hypertension.  CHEST - 2 VIEW  Comparison: 09/17/2007  Findings: Mild cardiomegaly.  No confluent airspace opacities or effusions.  No acute bony abnormality.  IMPRESSION: Cardiomegaly.  No acute findings.   Original Report Authenticated By: Charlett Nose, M.D.     Other results: EKG: sinus rhythm, rate 60, LA deviation, inverted p waves in V1, prolonged QT which is unchanged from previous, new T wave inversions in V4,5 V2, 3.  Assessment & Plan by Problem: Principal Problem:   Hypokalemia Active Problems:   Hypertension   Hyperlipemia   Hypokalemia Patient presents with vomiting and decreased by mouth intake for 4 days prior to admission  along with diuretic use. Her potassium was found to be 2.7 on admission, chloride also low at 86. Patient was given 2 L of normal saline in the ED as well as 40 mEq of by mouth K-Dur. Repeat potassium on i-STAT after normal saline bolus showed potassium 2.3.  -Admit to telemetry -Check orthostatic vital signs -Start gentle hydration with normal saline with potassium 40 mEq per liter at 125 cc per hour -Replete potassium orally Zofran when necessary for nausea -Repeat BMP at 6pm, check magnesium, troponins -repeat EKG in AM  Hyponatremia Patient's sodium on admission was 130, coming up to 134 after IV fluids. Patient appeared hypovolemic on admission this was likely due to the vomiting and poor PO intake. -Continue IV fluids  with potassium -Repeat BMP  Possible acute kidney injury Creatinine was 1.08 on admission, bumped up to 1.2 after IV fluids. BUN is elevated to 45. Unclear if iSTAT is accurate. Will repeat BMP at 6pm and monitor.  Subjective weakness Patient and her husband reports subjective weakness the patient does not have any neurologic deficits on exam. What the patient describes is more likely to be fatigue from dehydration. -Check orthostatics -Hydrate with normal saline as above -Replete potassium as above  Abnormal urinalysis Urinalysis on admission showed small hemoglobin, protein, nitrate negative, a low to 20 WBCs, many bacteria, rare squamous cells. Patient is afebrile, does not have any symptoms of dysuria or urinary frequency. Will defer treatment at this time as patient is asymptomatic. She will let us know if she does develop symptoms. Urine culture pending  Hypertension Hold diuretic in the setting of hypovolemia and normal blood pressure. Continue to monitor, may need to restart home medications tomorrow. -continue aspirin  Hyperlipidemia -Continue home Zocor -Consider statin myopathy if patient subjective weakness does not improve with electrolyte  replacement therapy and hydration  Diet Regular  DVT prophylaxis Lovenox  Disposition Anticipated discharge in one to 2 days PCP Dr. Jeannetta Nap    Signed: Denton Ar 02/17/2013, 3:23 PM

## 2013-02-17 NOTE — ED Provider Notes (Signed)
Patient generalized weakness for the past several days. Had several episodes of vomiting possibly 6 days ago which has since resolved. Her husband reports that she's had diminished appetite since patient denies pain anywhere. Feels fatigued. Her husband states that this morning she can only walk a short distance before becoming tired on exam alert [place] to come score 15 mucous membranes dry lungs clear auscultation heart regular rate and rhythm gait is normal moves all extremities EKG shows evidence of hypokalemia. Spoke with internal medicine resident physician who will arrange for patient's day Results for orders placed during the hospital encounter of 02/17/13  CBC WITH DIFFERENTIAL      Result Value Range   WBC 11.8 (*) 4.0 - 10.5 K/uL   RBC 5.38 (*) 3.87 - 5.11 MIL/uL   Hemoglobin 16.5 (*) 12.0 - 15.0 g/dL   HCT 16.1  09.6 - 04.5 %   MCV 83.1  78.0 - 100.0 fL   MCH 30.7  26.0 - 34.0 pg   MCHC 36.9 (*) 30.0 - 36.0 g/dL   RDW 40.9  81.1 - 91.4 %   Platelets 223  150 - 400 K/uL   Neutrophils Relative 77  43 - 77 %   Neutro Abs 9.1 (*) 1.7 - 7.7 K/uL   Lymphocytes Relative 13  12 - 46 %   Lymphs Abs 1.5  0.7 - 4.0 K/uL   Monocytes Relative 10  3 - 12 %   Monocytes Absolute 1.2 (*) 0.1 - 1.0 K/uL   Eosinophils Relative 0  0 - 5 %   Eosinophils Absolute 0.0  0.0 - 0.7 K/uL   Basophils Relative 0  0 - 1 %   Basophils Absolute 0.0  0.0 - 0.1 K/uL  COMPREHENSIVE METABOLIC PANEL      Result Value Range   Sodium 130 (*) 135 - 145 mEq/L   Potassium 2.7 (*) 3.5 - 5.1 mEq/L   Chloride 86 (*) 96 - 112 mEq/L   CO2 29  19 - 32 mEq/L   Glucose, Bld 120 (*) 70 - 99 mg/dL   BUN 45 (*) 6 - 23 mg/dL   Creatinine, Ser 7.82  0.50 - 1.10 mg/dL   Calcium 95.6  8.4 - 21.3 mg/dL   Total Protein 8.4 (*) 6.0 - 8.3 g/dL   Albumin 3.9  3.5 - 5.2 g/dL   AST 27  0 - 37 U/L   ALT 14  0 - 35 U/L   Alkaline Phosphatase 77  39 - 117 U/L   Total Bilirubin 1.0  0.3 - 1.2 mg/dL   GFR calc non Af Amer 47 (*) >90  mL/min   GFR calc Af Amer 54 (*) >90 mL/min  URINALYSIS, ROUTINE W REFLEX MICROSCOPIC      Result Value Range   Color, Urine YELLOW  YELLOW   APPearance CLOUDY (*) CLEAR   Specific Gravity, Urine 1.021  1.005 - 1.030   pH 6.0  5.0 - 8.0   Glucose, UA NEGATIVE  NEGATIVE mg/dL   Hgb urine dipstick SMALL (*) NEGATIVE   Bilirubin Urine NEGATIVE  NEGATIVE   Ketones, ur NEGATIVE  NEGATIVE mg/dL   Protein, ur 30 (*) NEGATIVE mg/dL   Urobilinogen, UA 0.2  0.0 - 1.0 mg/dL   Nitrite NEGATIVE  NEGATIVE   Leukocytes, UA MODERATE (*) NEGATIVE  URINE MICROSCOPIC-ADD ON      Result Value Range   Squamous Epithelial / LPF RARE  RARE   WBC, UA 11-20  <3 WBC/hpf  RBC / HPF 0-2  <3 RBC/hpf   Bacteria, UA MANY (*) RARE   Casts HYALINE CASTS (*) NEGATIVE  POCT I-STAT, CHEM 8      Result Value Range   Sodium 134 (*) 135 - 145 mEq/L   Potassium 2.3 (*) 3.5 - 5.1 mEq/L   Chloride 93 (*) 96 - 112 mEq/L   BUN 42 (*) 6 - 23 mg/dL   Creatinine, Ser 5.62 (*) 0.50 - 1.10 mg/dL   Glucose, Bld 130 (*) 70 - 99 mg/dL   Calcium, Ion 8.65 (*) 1.13 - 1.30 mmol/L   TCO2 30  0 - 100 mmol/L   Hemoglobin 13.6  12.0 - 15.0 g/dL   HCT 78.4  69.6 - 29.5 %   Comment NOTIFIED PHYSICIAN     Dg Chest 2 View  02/17/2013  *RADIOLOGY REPORT*  Clinical Data: Fatigue, hypertension.  CHEST - 2 VIEW  Comparison: 09/17/2007  Findings: Mild cardiomegaly.  No confluent airspace opacities or effusions.  No acute bony abnormality.  IMPRESSION: Cardiomegaly.  No acute findings.   Original Report Authenticated By: Charlett Nose, M.D.      Doug Sou, MD 02/17/13 1359

## 2013-02-17 NOTE — ED Notes (Signed)
Results of chem 8 shown to [employer]

## 2013-02-18 DIAGNOSIS — E876 Hypokalemia: Principal | ICD-10-CM

## 2013-02-18 LAB — CBC
HCT: 33.2 % — ABNORMAL LOW (ref 36.0–46.0)
Hemoglobin: 11.9 g/dL — ABNORMAL LOW (ref 12.0–15.0)
MCV: 84.1 fL (ref 78.0–100.0)
RBC: 3.95 MIL/uL (ref 3.87–5.11)
RDW: 12.4 % (ref 11.5–15.5)
WBC: 7.1 10*3/uL (ref 4.0–10.5)

## 2013-02-18 LAB — BASIC METABOLIC PANEL
CO2: 27 mEq/L (ref 19–32)
Chloride: 110 mEq/L (ref 96–112)
Creatinine, Ser: 0.83 mg/dL (ref 0.50–1.10)
GFR calc Af Amer: 75 mL/min — ABNORMAL LOW (ref >90)
Potassium: 4.8 mEq/L (ref 3.5–5.1)

## 2013-02-18 NOTE — H&P (Signed)
Internal Medicine Teaching Service Attending Note Date: 02/18/2013  Patient name: [name]  Medical record number: [id number]  Date of birth: [birth year]   I have seen and evaluated Jonn Shingles and discussed their care with the Residency Team. Please see Dr Vinnie Level H&P for full details. Ms Schofield was in usual state of health until the 25th when she developed bilious, non bloody vomiting. The vomiting resolved but the 26th - admission, she had nausea and anorexia. She had very little solid PO intake but did drink some liquids. She denied D or ABD pain during this time. On the day of admission, she was very weak and unable to walk more than a few steps and came to ED for Eval. She states that other members of her church have had a similar illness. She cont to take her meds over this time and that inc a BB and HCTZ. Of note, she states that even on good days, she doesn't have a great appetite but hasn't lost any weight.  Physical Exam: Blood pressure 136/44, pulse 73, temperature 98.7 F (37.1 C), temperature source Oral, resp. rate 20, weight 117 lb 3 oz (53.156 kg), SpO2 97.00%. GEN : NAD, lying in bed HEENT [place] AT, EOMI, sclera clear HRRR LCTAB ABD + BS, S, NT, ND Ext no edema Skin no rashes Neuro A&O, nl mood and affect, no gross abnl  Labs and imaging were reviewed and pertinent for K 2.7, now 4.8; Na 130, now 139; Cr 0.83; HgB 16.5.  Assessment and Plan: I agree with the formulated Assessment and Plan with the following changes:   1. Acute gastroenteritis - pts sxs have resolved. She is able to take PO and didn't require Zofran overnight. She can safely be D/C'd home  2. Hypokalemia - 2/2 GI loss and decreased intake. K 4.8 today  3. Hyponatremia - 2/2 vol contraction from AGE. IVF resulted in normalization of Na and now is 139.  4. Asymptomatic bacteruria  Burns Spain, MD 4/30/20144:26 PM

## 2013-02-18 NOTE — Discharge Summary (Signed)
Internal Medicine Teaching [facility] Discharge Note  Name: [name] MRN: [id number] DOB: [birth year] 79 y.o.  Date of Admission: 02/17/2013  9:03 AM Date of Discharge: 02/18/2013 Attending Physician: Burns Spain, MD  Discharge Diagnosis: Principal Problem:   Hypokalemia Active Problems:   Hypertension   Hyperlipemia   Discharge Medications:   Medication List    TAKE these medications       aspirin EC 81 MG tablet  Take 81 mg by mouth daily.     atenolol 100 MG tablet  Commonly known as:  TENORMIN  Take 100 mg by mouth daily.     pravastatin 40 MG tablet  Commonly known as:  PRAVACHOL  Take 40 mg by mouth daily.     triamterene-hydrochlorothiazide 75-50 MG per tablet  Commonly known as:  MAXZIDE  Take 0.5 tablets by mouth daily.        Disposition and follow-up:   [name] was discharged from [facility] in Stable condition.  At the hospital follow up visit please address the following  -follow up anorexia and nausea -follow up regular medical care including blood pressure check and age-appropriate cancer screenings  Follow-up Appointments:      Discharge Orders   Future Orders Complete By Expires     Diet - low sodium heart healthy  As directed     Increase activity slowly  As directed        Consultations:   none  Procedures Performed:  Dg Chest 2 View  02/17/2013  *RADIOLOGY REPORT*  Clinical Data: Fatigue, hypertension.  CHEST - 2 VIEW  Comparison: 09/17/2007  Findings: Mild cardiomegaly.  No confluent airspace opacities or effusions.  No acute bony abnormality.  IMPRESSION: Cardiomegaly.  No acute findings.   Original Report Authenticated By: Charlett Nose, M.D.     Admission HPI: [name] is an 79 year old female with a history of high blood pressure and high cholesterol, who presents with nausea and vomiting that started on Friday. Patient states that she was vomiting all day on Friday, bilious, nonbloody, and  continued to feel bad throughout the weekend. Her vomiting decreased some but she continued to have nausea and she has not eating anything all weekend. Her fluid intake has been minimal as well, just some water and juice here and there. Her husband brought her into the hospital today because this morning she was only able to walk a few steps without feeling extraordinarily fatigued. Her husband was concerned since she had not been eating or drinking very much. Patient endorses nausea but the vomiting has resolved. She states that she spent most of the past 3-4 days laying on the couch. She does not have any abdominal pain, no dysuria, no urinary frequency, no shortness of breath, no chest pain, and no diarrhea. Patient denies any melena, hematochezia. She does state that people at her church have been sick with a GI illness recently. Her husband has not been sick. Patient did continue to take her blood pressure medications while she was feeling ill this weekend.  Social history: Patient denies using any alcohol or drugs. She does smoke about 3 cigarettes per day.   Hospital Course by problem list: Principal Problem:   Hypokalemia Active Problems:   Hypertension   Hyperlipemia    Nausea, decreased appetite, electrolyte abnormalities Patient presented with fatigue after an acute vomiting followed by anorexia and nausea over 3 days prior to admission. In the ED, her potassium was found to be 2.7, chloride 86,  sodium 130, and she appeared dehydrated. Patient was given 2 L of normal saline and oral potassium replacement in the emergency room. She was then admitted to a regular floor bed, continued on IV fluids with potassium, and given additional oral potassium replacement.  The day of discharge, patient's potassium had normalized to 4.8, sodium was 139, and the patient appeared well-hydrated. Patient was feeling much better and was able to walk around without any difficulties. She had a decreased appetite  but she was able to eat and drink without any nausea or vomiting. Patient was offered a Zofran prescription which she declined as she did not use any of this during her hospitalization and did not feel like she needed it. Patient will see her PCP next week for followup.  Asymptomatic bacteriuria Patient urinalysis in the emergency room showed 11-20 WBCs, many bacteria, nitrite negative, small hemoglobin, rare squamous cells. Patient was afebrile and denied any symptoms of dysuria, flank pain, urinary frequency, both on admission and before discharge. Therefore, we did not start her on any therapy at this time. She knows to seek medical attention if she does develop symptoms. Pulmonary urine culture results show undifferentiated gram-negative rods.  Hypertension Patient's home triamterene/HCTZ and atenolol were held during this admission due to initial hypovolemia. On the day of discharge, her blood pressure was 136/44 and she was instructed to resume her blood pressure medications on discharge with close followup with her PCP.   Discharge Vitals:  BP 136/44  Pulse 73  Temp(Src) 98.7 F (37.1 C) (Oral)  Resp 20  Wt 117 lb 3 oz (53.156 kg)  SpO2 97%  Discharge Labs:  Results for orders placed during the hospital encounter of 02/17/13 (from the past 24 hour(s))  POCT I-STAT TROPONIN I     Status: None   Collection Time    02/17/13  4:19 PM      Result Value Range   Troponin i, poc 0.00  0.00 - 0.08 ng/mL   Comment 3           MAGNESIUM     Status: None   Collection Time    02/17/13  4:22 PM      Result Value Range   Magnesium 1.9  1.5 - 2.5 mg/dL  BASIC METABOLIC PANEL     Status: Abnormal   Collection Time    02/17/13  5:37 PM      Result Value Range   Sodium 134 (*) 135 - 145 mEq/L   Potassium 2.5 (*) 3.5 - 5.1 mEq/L   Chloride 97  96 - 112 mEq/L   CO2 29  19 - 32 mEq/L   Glucose, Bld 106 (*) 70 - 99 mg/dL   BUN 37 (*) 6 - 23 mg/dL   Creatinine, Ser 4.09  0.50 - 1.10 mg/dL    Calcium 8.3 (*) 8.4 - 10.5 mg/dL   GFR calc non Af Amer 53 (*) >90 mL/min   GFR calc Af Amer 61 (*) >90 mL/min  BASIC METABOLIC PANEL     Status: Abnormal   Collection Time    02/17/13  8:10 PM      Result Value Range   Sodium 133 (*) 135 - 145 mEq/L   Potassium 2.9 (*) 3.5 - 5.1 mEq/L   Chloride 99  96 - 112 mEq/L   CO2 27  19 - 32 mEq/L   Glucose, Bld 113 (*) 70 - 99 mg/dL   BUN 34 (*) 6 - 23 mg/dL   Creatinine, Ser  0.94  0.50 - 1.10 mg/dL   Calcium 8.3 (*) 8.4 - 10.5 mg/dL   GFR calc non Af Amer 55 (*) >90 mL/min   GFR calc Af Amer 64 (*) >90 mL/min  CREATININE, URINE, RANDOM     Status: None   Collection Time    02/18/13  4:45 AM      Result Value Range   Creatinine, Urine 75.40    SODIUM, URINE, RANDOM     Status: None   Collection Time    02/18/13  4:45 AM      Result Value Range   Sodium, Ur 48    BASIC METABOLIC PANEL     Status: Abnormal   Collection Time    02/18/13  6:40 AM      Result Value Range   Sodium 139  135 - 145 mEq/L   Potassium 4.8  3.5 - 5.1 mEq/L   Chloride 110  96 - 112 mEq/L   CO2 27  19 - 32 mEq/L   Glucose, Bld 101 (*) 70 - 99 mg/dL   BUN 32 (*) 6 - 23 mg/dL   Creatinine, Ser 1.61  0.50 - 1.10 mg/dL   Calcium 7.8 (*) 8.4 - 10.5 mg/dL   GFR calc non Af Amer 64 (*) >90 mL/min   GFR calc Af Amer 75 (*) >90 mL/min  CBC     Status: Abnormal   Collection Time    02/18/13  6:40 AM      Result Value Range   WBC 7.1  4.0 - 10.5 K/uL   RBC 3.95  3.87 - 5.11 MIL/uL   Hemoglobin 11.9 (*) 12.0 - 15.0 g/dL   HCT 09.6 (*) 04.5 - 40.9 %   MCV 84.1  78.0 - 100.0 fL   MCH 30.1  26.0 - 34.0 pg   MCHC 35.8  30.0 - 36.0 g/dL   RDW 81.1  91.4 - 78.2 %   Platelets 183  150 - 400 K/uL    Signed: Denton Ar 02/18/2013, 2:06 PM   Time Spent on Discharge: 35 minutes Services Ordered on Discharge: none Equipment Ordered on Discharge: none

## 2013-02-18 NOTE — Progress Notes (Signed)
Subjective: Patient states that she feels better this morning. She denies any nausea or vomiting, no diarrhea. She ate a piece of fruit and some juice for breakfast without any difficulty. No appetite for the rest of her meal. Patient states she was able to walk around the room without any difficulty.  Denies SOB, chest pain, abdominal pain, NVD.  Objective: Vital signs in last 24 hours: Filed Vitals:   02/17/13 1803 02/17/13 1806 02/17/13 2047 02/18/13 0405  BP: 145/90 156/93 152/52 109/51  Pulse: 103 110 64 61  Temp:   99.1 F (37.3 C) 98.6 F (37 C)  TempSrc:   Oral Oral  Resp: 18 19 18 18   Weight:   117 lb 3 oz (53.156 kg)   SpO2: 99% 100% 98% 96%   Weight change:   Intake/Output Summary (Last 24 hours) at 02/18/13 1051 Last data filed at 02/18/13 0440  Gross per 24 hour  Intake    290 ml  Output    100 ml  Net    190 ml    Physical Exam Blood pressure 109/51, pulse 61, temperature 98.6 F (37 C), temperature source Oral, resp. rate 18, weight 117 lb 3 oz (53.156 kg), SpO2 96.00%. General: No acute distress, alert and oriented x 3, well-appearing Asian female, appears younger than stated age  HEENT: PERRL, EOMI, moist mucous membranes, oropharynx clear, no lesions  Cardiovascular: Regular rate and rhythm, no murmurs, rubs or gallops  Respiratory: Clear to auscultation bilaterally, no wheezes, rales, or rhonchi  Abdomen: Soft, nondistended, nontender to palpation, bowel sounds present  Extremities: Warm and well-perfused, no edema.  Skin: Warm, dry, no rashes  Neuro: No facial droop, strength is 5 out of 5 both upper and lower extremities, symmetric. Gait testing deferred.   Lab Results: Basic Metabolic Panel:  Recent Labs Lab 02/17/13 1622  02/17/13 2010 02/18/13 0640  NA  --   < > 133* 139  K  --   < > 2.9* 4.8  CL  --   < > 99 110  CO2  --   < > 27 27  GLUCOSE  --   < > 113* 101*  BUN  --   < > 34* 32*  CREATININE  --   < > 0.94 0.83  CALCIUM  --   < >  8.3* 7.8*  MG 1.9  --   --   --   < > = values in this interval not displayed. Liver Function Tests:  Recent Labs Lab 02/17/13 1011  AST 27  ALT 14  ALKPHOS 77  BILITOT 1.0  PROT 8.4*  ALBUMIN 3.9   CBC:  Recent Labs Lab 02/17/13 1011 02/17/13 1317 02/18/13 0640  WBC 11.8*  --  7.1  NEUTROABS 9.1*  --   --   HGB 16.5* 13.6 11.9*  HCT 44.7 40.0 33.2*  MCV 83.1  --  84.1  PLT 223  --  183   Urinalysis:  Recent Labs Lab 02/17/13 1133  COLORURINE YELLOW  LABSPEC 1.021  PHURINE 6.0  GLUCOSEU NEGATIVE  HGBUR SMALL*  BILIRUBINUR NEGATIVE  KETONESUR NEGATIVE  PROTEINUR 30*  UROBILINOGEN 0.2  NITRITE NEGATIVE  LEUKOCYTESUR MODERATE*   Studies/Results: Dg Chest 2 View  02/17/2013  *RADIOLOGY REPORT*  Clinical Data: Fatigue, hypertension.  CHEST - 2 VIEW  Comparison: 09/17/2007  Findings: Mild cardiomegaly.  No confluent airspace opacities or effusions.  No acute bony abnormality.  IMPRESSION: Cardiomegaly.  No acute findings.   Original Report Authenticated By: Caryn Bee  Kearney Hard, M.D.    Medications:  Medications reviewed  Scheduled Meds: . aspirin EC  81 mg Oral Daily  . enoxaparin (LOVENOX) injection  40 mg Subcutaneous Q24H  . simvastatin  20 mg Oral q1800  . sodium chloride  3 mL Intravenous Q12H   Continuous Infusions:  PRN Meds:.ondansetron (ZOFRAN) IV, ondansetron  Assessment/Plan:  Nausea, Decreased appetite, Hypokalemia: resolved Patient presents with vomiting and decreased by mouth intake for 4 days prior to admission along with diuretic use. Her potassium was found to be 2.7 on admission, chloride also low at 86. Patient was given 2 L of normal saline in the ED as well as 40 mEq of by mouth K-Dur. Repeat potassium on i-STAT after normal saline bolus showed potassium 2.3.  Possibly gastroenteritis with residual anorexia from negative food associations vs IBS vs gastroparesis. 02/19/12: K 4.8 this morning  -DC IVF -patient did not use zofran overnight, no  nausea -hypokalemia resolved -age appropriate cancer screenings as outpatient  Hyponatremia : resolved, Na 139 this morning Patient's sodium on admission was 130, came up to 134 after IV fluids. Patient appeared hypovolemic on admission this was likely due to the vomiting and poor PO intake.   Subjective weakness : resolved Patient and her husband reports subjective weakness the patient does not have any neurologic deficits on exam. What the patient describes is more likely to be fatigue from dehydration and hypokalemia.  -will walk patient before discharge  Abnormal urinalysis  Urinalysis on admission showed small hemoglobin, protein, nitrate negative, a low to 20 WBCs, many bacteria, rare squamous cells.  Patient is afebrile, does not have any symptoms of dysuria or urinary frequency.  Will defer treatment at this time as patient is asymptomatic. She will let us know if she does develop symptoms. Urine culture pending   Hypertension  Hold diuretic in the setting of hypovolemia and normal blood pressure. Continue to monitor, may need to restart home medications tomorrow.  -continue aspirin  -restart tiampterene-HCTZ at hospital follow up appointment with blood pressure check  Hyperlipidemia  -Continue home Zocor  -Consider statin myopathy if patient subjective weakness does not improve with electrolyte replacement therapy and hydration. LFTs wnl.  Diet  Regular   DVT prophylaxis  Lovenox   Disposition  Anticipated discharge this afternoon if patient can walk without difficulty and eat lunch. PCP Dr. Jeannetta Nap    LOS: 1 day   [place] Ar 02/18/2013, 10:51 AM

## 2013-02-18 NOTE — Progress Notes (Signed)
Utilization review completed.  

## 2013-02-19 LAB — URINE CULTURE: Colony Count: >=100000

## 2013-03-21 ENCOUNTER — Encounter (HOSPITAL_COMMUNITY): Payer: Self-pay | Admitting: *Deleted

## 2013-03-21 ENCOUNTER — Inpatient Hospital Stay (HOSPITAL_COMMUNITY)
Admission: EM | Admit: 2013-03-21 | Discharge: 2013-03-25 | DRG: 689 | Disposition: A | Discharge status: Home / Self Care | Payer: Medicare Other | Attending: Internal Medicine | Admitting: Internal Medicine

## 2013-03-21 DIAGNOSIS — E785 Hyperlipidemia, unspecified: Secondary | ICD-10-CM | POA: Diagnosis present

## 2013-03-21 DIAGNOSIS — R5381 Other malaise: Secondary | ICD-10-CM | POA: Diagnosis present

## 2013-03-21 DIAGNOSIS — E876 Hypokalemia: Secondary | ICD-10-CM | POA: Diagnosis present

## 2013-03-21 DIAGNOSIS — I951 Orthostatic hypotension: Secondary | ICD-10-CM

## 2013-03-21 DIAGNOSIS — E871 Hypo-osmolality and hyponatremia: Secondary | ICD-10-CM | POA: Diagnosis present

## 2013-03-21 DIAGNOSIS — N390 Urinary tract infection, site not specified: Principal | ICD-10-CM | POA: Diagnosis present

## 2013-03-21 DIAGNOSIS — Z7982 Long term (current) use of aspirin: Secondary | ICD-10-CM

## 2013-03-21 DIAGNOSIS — E43 Unspecified severe protein-calorie malnutrition: Secondary | ICD-10-CM | POA: Insufficient documentation

## 2013-03-21 DIAGNOSIS — D72829 Elevated white blood cell count, unspecified: Secondary | ICD-10-CM | POA: Diagnosis present

## 2013-03-21 DIAGNOSIS — R7309 Other abnormal glucose: Secondary | ICD-10-CM | POA: Diagnosis present

## 2013-03-21 DIAGNOSIS — I10 Essential (primary) hypertension: Secondary | ICD-10-CM | POA: Diagnosis present

## 2013-03-21 DIAGNOSIS — R55 Syncope and collapse: Secondary | ICD-10-CM | POA: Diagnosis present

## 2013-03-21 DIAGNOSIS — F17200 Nicotine dependence, unspecified, uncomplicated: Secondary | ICD-10-CM | POA: Diagnosis present

## 2013-03-21 DIAGNOSIS — E782 Mixed hyperlipidemia: Secondary | ICD-10-CM | POA: Diagnosis present

## 2013-03-21 DIAGNOSIS — A498 Other bacterial infections of unspecified site: Secondary | ICD-10-CM | POA: Diagnosis present

## 2013-03-21 DIAGNOSIS — IMO0002 Reserved for concepts with insufficient information to code with codable children: Secondary | ICD-10-CM

## 2013-03-21 DIAGNOSIS — D649 Anemia, unspecified: Secondary | ICD-10-CM | POA: Diagnosis present

## 2013-03-21 DIAGNOSIS — R5383 Other fatigue: Secondary | ICD-10-CM | POA: Diagnosis present

## 2013-03-21 LAB — CBC WITH DIFFERENTIAL/PLATELET
Basophils Relative: 0 % (ref 0–1)
Eosinophils Absolute: 0 10*3/uL (ref 0.0–0.7)
Eosinophils Relative: 0 % (ref 0–5)
HCT: 24.3 % — ABNORMAL LOW (ref 36.0–46.0)
Hemoglobin: 8 g/dL — ABNORMAL LOW (ref 12.0–15.0)
Lymphs Abs: 0.7 10*3/uL (ref 0.7–4.0)
MCH: 30.1 pg (ref 26.0–34.0)
MCHC: 32.9 g/dL (ref 30.0–36.0)
MCV: 91.4 fL (ref 78.0–100.0)
Monocytes Absolute: 1.1 10*3/uL — ABNORMAL HIGH (ref 0.1–1.0)
Monocytes Relative: 9 % (ref 3–12)
RBC: 2.66 MIL/uL — ABNORMAL LOW (ref 3.87–5.11)

## 2013-03-21 LAB — COMPREHENSIVE METABOLIC PANEL
Albumin: 3 g/dL — ABNORMAL LOW (ref 3.5–5.2)
Alkaline Phosphatase: 59 U/L (ref 39–117)
BUN: 55 mg/dL — ABNORMAL HIGH (ref 6–23)
Creatinine, Ser: 1.09 mg/dL (ref 0.50–1.10)
GFR calc Af Amer: 54 mL/min — ABNORMAL LOW (ref >90)
Glucose, Bld: 156 mg/dL — ABNORMAL HIGH (ref 70–99)
Total Protein: 6 g/dL (ref 6.0–8.3)

## 2013-03-21 LAB — POCT I-STAT TROPONIN I: Troponin i, poc: 0.01 ng/mL (ref 0.00–0.08)

## 2013-03-21 LAB — RETICULOCYTES
RBC.: 2.63 MIL/uL — ABNORMAL LOW (ref 3.87–5.11)
Retic Ct Pct: 5.1 % — ABNORMAL HIGH (ref 0.4–3.1)

## 2013-03-21 LAB — SAMPLE TO BLOOD BANK

## 2013-03-21 LAB — MAGNESIUM: Magnesium: 2.2 mg/dL (ref 1.5–2.5)

## 2013-03-21 MED ORDER — ONDANSETRON HCL 4 MG/2ML IJ SOLN: 4.0000 mg | Freq: Four times a day (QID) | INTRAMUSCULAR | Status: DC | PRN

## 2013-03-21 MED ORDER — SODIUM CHLORIDE 0.9 % IJ SOLN
3.0000 mL | Freq: Two times a day (BID) | INTRAMUSCULAR | Status: DC
  Administered 2013-03-22 – 2013-03-23 (×2): 3 mL via INTRAVENOUS

## 2013-03-21 MED ORDER — SODIUM CHLORIDE 0.9 % IV SOLN
Freq: Once | INTRAVENOUS | Status: AC
  Administered 2013-03-21: 19:00:00 via INTRAVENOUS

## 2013-03-21 MED ORDER — POTASSIUM CHLORIDE CRYS ER 20 MEQ PO TBCR
40.0000 meq | EXTENDED_RELEASE_TABLET | Freq: Once | ORAL | Status: AC
  Administered 2013-03-21: 40 meq via ORAL
  Filled 2013-03-21: qty 2

## 2013-03-21 MED ORDER — SODIUM CHLORIDE 0.9 % IV SOLN
INTRAVENOUS | Status: DC
Start: 2013-03-21 — End: 2013-03-23
  Administered 2013-03-22: 22:00:00 via INTRAVENOUS

## 2013-03-21 MED ORDER — ONDANSETRON HCL 4 MG PO TABS: 4.0000 mg | ORAL_TABLET | Freq: Four times a day (QID) | ORAL | Status: DC | PRN

## 2013-03-21 MED ORDER — SODIUM CHLORIDE 0.9 % IV SOLN
Freq: Once | INTRAVENOUS | Status: AC
Start: 2013-03-21 — End: 2013-03-21
  Administered 2013-03-21: 22:00:00 via INTRAVENOUS

## 2013-03-21 MED ORDER — POTASSIUM CHLORIDE 10 MEQ/100ML IV SOLN
10.0000 meq | INTRAVENOUS | Status: AC
  Administered 2013-03-21 – 2013-03-22 (×3): 10 meq via INTRAVENOUS
  Filled 2013-03-21 (×3): qty 100

## 2013-03-21 MED ORDER — SODIUM CHLORIDE 0.9 % IV BOLUS (SEPSIS)
1000.0000 mL | Freq: Once | INTRAVENOUS | Status: AC
  Administered 2013-03-21: 1000 mL via INTRAVENOUS

## 2013-03-21 NOTE — ED Notes (Signed)
MD at bedside. 

## 2013-03-21 NOTE — ED Notes (Signed)
Per EMS- called out for syncopal episode. While there pt had an episode of staring to the right and not responding for approx 1 minute. Pt was sitting up without assistance during event. No postictal states. Pt was noted to be incontinence of urine. No siezure hx.

## 2013-03-21 NOTE — ED Notes (Signed)
Pt started jerking while standing i put my arms around her and pt started to go out and I caught her and scooped pt up and placed back in the bed. Pt stayed focused to the rt but was alert and talking the whole time. I got pt all fixed back in bed and notified Rn Nikki and DR Knapp.7:09pm JG.

## 2013-03-21 NOTE — ED Notes (Addendum)
Pt noted to have dry mucous membranes and pale. Family states that pt has had nausea and vomiting since Tuesday. Generalized weakness. Decreased appetite.

## 2013-03-21 NOTE — ED Notes (Signed)
RN spoke with MD about pt's plan of care.

## 2013-03-21 NOTE — ED Notes (Signed)
Critical Potassium 2.7 verified by lab.   MD notified

## 2013-03-21 NOTE — ED Provider Notes (Signed)
History     CSN: [id number]  Arrival date & time 03/21/13  8295   First MD Initiated Contact with Patient 03/21/13 1830      Chief Complaint  Patient presents with  . Loss of Consciousness    (Consider location/radiation/quality/duration/timing/severity/associated sxs/prior treatment) HPI history obtained from husband, EMS and patient. Husband relates patient was admitted to the hospital for 2 days about a month ago for electrolyte problems and a possible infection. He reports and she was discharged she has not gotten back to her normal self. She was seen by her PCP 4 days ago and had blood work and a urine test done. She was started on Cipro for possible urinary tract infection. He states he was called yesterday that her blood work was normal. He was advised to have her finish the Cipro. They report she has not been eating since she was discharged from the hospital a month ago. She states she has nausea and has lost appetite. Husband states she had vomiting 5 nights ago but not since. She did report to him she had vomiting but he did not see any evidence of that this morning. He reports about 4:30 pm she had decided she was ready to eat however he noted she  acutely became unresponsive and would've fallen out of her chair if he hadn't grabbed her. He states she was extremely limp. He was able to lay her down on the floor without injury. He states she was unconscious for about 5-6 minutes and she her breathing was hard for about 2-3 minutes. He denies any color changes. He states her skin felt cool and slightly moist. He denies seeing any jerking or twitching. He reports she was not confused when she came around. EMS reports when they got there the first responders had her sitting up and she appeared to almost have another syncopal episode. They report there was no postictal period. EMS stated that she would appear to be gazing to the right and just was not responding. Patient states she felt fine  earlier today and denies any kind of pain. She does admit to nausea. She denies headache, chest pain, shortness of breath, abdominal pain, black or melanotic stools, blood in her stools. She does report feeling weak and has been states she has been shuffling when she walks "like [employer]".  PCP Dr Jeannetta Nap  Past Medical History  Diagnosis Date  . Hypertension   . High cholesterol   . Cardiomegaly     Hattie Perch 02/17/2013    Past Surgical History  Procedure Laterality Date  . No past surgeries      No family history on file.  History  Substance Use Topics  . Smoking status: Current Every Day Smoker -- 0.12 packs/day for 60 years    Types: Cigarettes  . Smokeless tobacco: Never Used  . Alcohol Use: No  lives at home Lives with spouse  OB History   Grav Para Term Preterm Abortions TAB SAB Ect Mult Living                  Review of Systems  All other systems reviewed and are negative.    Allergies  Review of patient's allergies indicates no known allergies.  Home Medications   Current Outpatient Rx  Name  Route  Sig  Dispense  Refill  . aspirin EC 81 MG tablet   Oral   Take 81 mg by mouth daily.         Marland Kitchen atenolol (  TENORMIN) 100 MG tablet   Oral   Take 100 mg by mouth daily.         . pravastatin (PRAVACHOL) 40 MG tablet   Oral   Take 40 mg by mouth daily.         Marland Kitchen triamterene-hydrochlorothiazide (MAXZIDE) 75-50 MG per tablet   Oral   Take 0.5 tablets by mouth daily.         Cipro x 4d  BP 114/54  Pulse 86  Temp(Src) 98.1 F (36.7 C) (Oral)  Resp 22  SpO2 98%  Vital signs normal    Orthostatic VS are positive, her blood pressure dropped to 75/43 on sitting. When she stood up the tech reports patient got limp, her eyes gaze to the right, however patient was responding verbally.  Physical Exam  Nursing note and vitals reviewed. Constitutional: She is oriented to person, place, and time. She appears well-developed and well-nourished.   Non-toxic appearance. She does not appear ill. No distress.  HENT:  Head: Normocephalic and atraumatic.  Right Ear: External ear normal.  Left Ear: External ear normal.  Nose: Nose normal. No mucosal edema or rhinorrhea.  Mouth/Throat: Oropharynx is clear and moist and mucous membranes are normal. No dental abscesses or edematous.  Dry tongue, no trauma to tongue  Eyes: Conjunctivae and EOM are normal. Pupils are equal, round, and reactive to light.  Neck: Normal range of motion and full passive range of motion without pain. Neck supple.  Cardiovascular: Normal rate, regular rhythm and normal heart sounds.  Exam reveals no gallop and no friction rub.   No murmur heard. Pulmonary/Chest: Effort normal and breath sounds normal. No respiratory distress. She has no wheezes. She has no rhonchi. She has no rales. She exhibits no tenderness and no crepitus.  Abdominal: Soft. Normal appearance and bowel sounds are normal. She exhibits no distension and no mass. There is no tenderness. There is no rebound and no guarding.  Musculoskeletal: Normal range of motion. She exhibits no edema and no tenderness.  Moves all extremities well.   Neurological: She is alert and oriented to person, place, and time. She has normal strength. No cranial nerve deficit.  Skin: Skin is warm, dry and intact. No rash noted. No erythema. There is pallor.  Psychiatric: She has a normal mood and affect. Her speech is normal and behavior is normal. Her mood appears not anxious.    ED Course  Procedures (including critical care time)  Medications  potassium chloride 10 mEq in 100 mL IVPB (10 mEq Intravenous New Bag/Given 03/21/13 2148)  0.9 %  sodium chloride infusion ( Intravenous Stopped 03/21/13 1910)  sodium chloride 0.9 % bolus 1,000 mL (0 mLs Intravenous Stopped 03/21/13 2110)  0.9 %  sodium chloride infusion ( Intravenous New Bag/Given 03/21/13 2148)  potassium chloride SA (K-DUR,KLOR-CON) CR tablet 40 mEq (40 mEq Oral  Given 03/21/13 2220)   Patient was extremely orthostatic hypotensive, she was given IV fluids in the ED. She is noted to have worsening of her baseline chronic anemia. She was also started on IV potassium for her hypokalemia.  Husband was given the results of her blood tests. He reports her blood work 4 days ago showed a potassium of 3.4. He reports a last time she was admitted her potassium was very low and on reviewing her chart it was down to 2.3.  21:45 Gherghe, admit to tele, team 10   Results for orders placed during the hospital encounter of 03/21/13  CBC WITH  DIFFERENTIAL      Result Value Range   WBC 12.9 (*) 4.0 - 10.5 K/uL   RBC 2.66 (*) 3.87 - 5.11 MIL/uL   Hemoglobin 8.0 (*) 12.0 - 15.0 g/dL   HCT 47.8 (*) 29.5 - 62.1 %   MCV 91.4  78.0 - 100.0 fL   MCH 30.1  26.0 - 34.0 pg   MCHC 32.9  30.0 - 36.0 g/dL   RDW 30.8  65.7 - 84.6 %   Platelets 270  150 - 400 K/uL   Neutrophils Relative % 85 (*) 43 - 77 %   Neutro Abs 11.0 (*) 1.7 - 7.7 K/uL   Lymphocytes Relative 6 (*) 12 - 46 %   Lymphs Abs 0.7  0.7 - 4.0 K/uL   Monocytes Relative 9  3 - 12 %   Monocytes Absolute 1.1 (*) 0.1 - 1.0 K/uL   Eosinophils Relative 0  0 - 5 %   Eosinophils Absolute 0.0  0.0 - 0.7 K/uL   Basophils Relative 0  0 - 1 %   Basophils Absolute 0.0  0.0 - 0.1 K/uL  COMPREHENSIVE METABOLIC PANEL      Result Value Range   Sodium 135  135 - 145 mEq/L   Potassium 2.7 (*) 3.5 - 5.1 mEq/L   Chloride 92 (*) 96 - 112 mEq/L   CO2 27  19 - 32 mEq/L   Glucose, Bld 156 (*) 70 - 99 mg/dL   BUN 55 (*) 6 - 23 mg/dL   Creatinine, Ser 9.62  0.50 - 1.10 mg/dL   Calcium 8.6  8.4 - 95.2 mg/dL   Total Protein 6.0  6.0 - 8.3 g/dL   Albumin 3.0 (*) 3.5 - 5.2 g/dL   AST 20  0 - 37 U/L   ALT 15  0 - 35 U/L   Alkaline Phosphatase 59  39 - 117 U/L   Total Bilirubin 0.2 (*) 0.3 - 1.2 mg/dL   GFR calc non Af Amer 46 (*) >90 mL/min   GFR calc Af Amer 54 (*) >90 mL/min  MAGNESIUM      Result Value Range   Magnesium  2.2  1.5 - 2.5 mg/dL  LIPASE, BLOOD      Result Value Range   Lipase 24  11 - 59 U/L  RETICULOCYTES      Result Value Range   Retic Ct Pct 5.1 (*) 0.4 - 3.1 %   RBC. 2.63 (*) 3.87 - 5.11 MIL/uL   Retic Count, Manual 134.1  19.0 - 186.0 K/uL  POCT I-STAT TROPONIN I      Result Value Range   Troponin i, poc 0.01  0.00 - 0.08 ng/mL   Comment 3           SAMPLE TO BLOOD BANK      Result Value Range   Blood Bank Specimen SAMPLE AVAILABLE FOR TESTING     Sample Expiration 03/22/2013     Laboratory interpretation all normal except anemia, hypokalemia, leukocytosis     No results found.   Date: 03/21/2013  Rate: 83  Rhythm: normal sinus rhythm  QRS Axis: normal  Intervals: QT prolonged  ST/T Wave abnormalities: nonspecific T wave changes  Conduction Disutrbances:none  Narrative Interpretation:   Old EKG Reviewed: none available    1. Syncope   2. Orthostatic hypotension   3. Hypokalemia   4. Anemia   5. Hyperlipemia   6. Hypertension     Plan admission   Devoria Albe, MD, Armando Gang  CRITICAL CARE Performed by: Devoria Albe L Total critical care time: 31 min Critical care time was exclusive of separately billable procedures and treating other patients. Critical care was necessary to treat or prevent imminent or life-threatening deterioration. Critical care was time spent personally by me on the following activities: development of treatment plan with patient and/or surrogate as well as nursing, discussions with consultants, evaluation of patient's response to treatment, examination of patient, obtaining history from patient or surrogate, ordering and performing treatments and interventions, ordering and review of laboratory studies, ordering and review of radiographic studies, pulse oximetry and re-evaluation of patient's condition.   MDM  patient has had poor appetite for the past month with some intermittent episodes of vomiting. She has significant hypotension with  orthostatic vital signs were done and had another near syncopal episode. She also has very significant hypokalemia which was normal 4 days ago. The etiology of her syncope is most likely her hypotension. She will be placed on monitor for possible arrhythmia from her hypokalemia.           Ward Givens, MD 03/22/13 0010

## 2013-03-21 NOTE — H&P (Addendum)
[facility] History and Physical  [name] RUE:[id number] DOB: April 15, 1930 DOA: 03/21/2013  Referring physician: Dr. Lynelle Doctor PCP: Kaleen Mask, MD  Specialists: none  Chief Complaint: weakness  HPI: [name] is a 79 y.o. female has a past medical history significant for hypertension, hyperlipidemia, hypokalemia (was hospitalized about a month ago), recent urinary tract infection diagnosed by her PCP 3 days ago (E coli sensitive to Ciprofloxacin), presents with a chief complaint of ongoing weakness. Since her last hospitalization, she continued to feel weak with intermittent nausea and vomiting. Her last vomiting episode was about 5 days ago. She denies any blood in her emesis, denies any blood in her stool or dark tarry stools. She denies any chest pain or breathing difficulties. She endorses poor appetite and generalized fatigue. She was seen by her PCP about 4 days ago with ongoing symptoms. She had a syncopal episode at home this afternoon reported by her husband, was unconscious for about 5 minutes and her breathing was labored. There was no reported postictal period. She denies any headaches. She denies any numbness or tingling in her arms or feet, she denies any focal weakness. In the emergency room, patient is orthostatic, initial laboratory reveals a hemoglobin of 8.0 which is lower than her normal, as well as a potassium of 2.7. Magnesium was normal at 2.2. She has mild leukocytosis of 12.9.  Review of Systems: As per history of present illness, otherwise negative.  Past Medical History  Diagnosis Date  . Hypertension   . High cholesterol   . Cardiomegaly     Hattie Perch 02/17/2013   Past Surgical History  Procedure Laterality Date  . No past surgeries     Social History:  reports that she has been smoking Cigarettes.  She has a 7.2 pack-year smoking history. She has never used smokeless tobacco. She reports that she does not drink alcohol or use illicit drugs.  No Known  Allergies  Family history noncontributory.  Prior to Admission medications   Medication Sig Start Date End Date Taking? Authorizing Provider  aspirin EC 81 MG tablet Take 81 mg by mouth daily.   Yes Historical Provider, MD  atenolol (TENORMIN) 100 MG tablet Take 50 mg by mouth daily.    Yes Historical Provider, MD  ciprofloxacin (CIPRO) 500 MG tablet Take 500 mg by mouth 2 (two) times daily. Take for only 2 1/2 days started Thursday 03-19-13   Yes Historical Provider, MD  triamterene-hydrochlorothiazide (MAXZIDE) 75-50 MG per tablet Take 0.5 tablets by mouth daily.   Yes Historical Provider, MD   Physical Exam: Filed Vitals:   03/21/13 1837 03/21/13 1849 03/21/13 1856 03/21/13 2038  BP: 114/54 106/40 75/43 114/59  Pulse: 86 83 88 76  Temp: 98.1 F (36.7 C) 98.5 F (36.9 C)    TempSrc: Oral Oral    Resp: 22 18 19 17   SpO2: 98% 98% 100% 98%     General:  No apparent distress, pale  Eyes: PERRL, EOMI, no scleral icterus  ENT: moist oropharynx  Neck: supple, no JVD  Cardiovascular: regular rate without MRG; 2+ peripheral pulses  Respiratory: CTA biL, good air movement without wheezing, rhonchi or crackled  Abdomen: soft, non tender to palpation, positive bowel sounds, no guarding, no rebound  Skin: no rashes  Musculoskeletal: no peripheral edema  Psychiatric: normal mood and affect  Neurologic: CN 2-12 grossly intact, MS 5/5 in all 4  Labs on Admission:  Basic Metabolic Panel:  Recent Labs Lab 03/21/13 1851  NA 135  K  2.7*  CL 92*  CO2 27  GLUCOSE 156*  BUN 55*  CREATININE 1.09  CALCIUM 8.6  MG 2.2   Liver Function Tests:  Recent Labs Lab 03/21/13 1851  AST 20  ALT 15  ALKPHOS 59  BILITOT 0.2*  PROT 6.0  ALBUMIN 3.0*   CBC:  Recent Labs Lab 03/21/13 1851  WBC 12.9*  NEUTROABS 11.0*  HGB 8.0*  HCT 24.3*  MCV 91.4  PLT 270   Radiological Exams on Admission: No results found.  EKG: Independently reviewed.  Assessment/Plan Active  Problems:   Hypokalemia   Hypertension   Hyperlipemia   Hypokalemia - Due to poor by mouth intake and ongoing nausea. Replete with by mouth and IV. - Telemetry - Monitor with morning labs.  Recent urinary tract infection - Cultures show Escherichia coli sensitive to ciprofloxacin - Continue ciprofloxacin here  Anemia - No apparent bleed - Check reticulocyte to determine if the bone marrow problem or destruction process - FOBT  Hypertension - Hold her diuretic, restart BB in the morning after hydration.   Syncope - due to dehydration in the setting of poor po intake. - hydrate and monitor.   Weakness/fatigue - Continue to hypokalemia and anemia - Will check TSH and cortisol for completeness  Leukocytosis  - Can be due to the urinary tract infection. Monitor.   DVT prophylaxis - SCDs for now, awaiting FOBT given anemia. If negative resume heparin products.  Code Status: Full  Family Communication: husband bedside  Disposition Plan: inpatient.   Time spent: 55  Newton Frutiger M. Elvera Lennox, MD [facility] Pager [phone]  If 7PM-7AM, please contact night-coverage [email or web address] Password [facility] 03/21/2013, 10:06 PM

## 2013-03-21 NOTE — ED Notes (Signed)
Dr. Lynelle Doctor made aware of pts orthostatic vitals. Pt noted to have lightheadedness with NT when standing pt was hypotensive in 50's systolic after attempting to stand. Pt given fluid bolus per dr knapp.

## 2013-03-22 LAB — COMPREHENSIVE METABOLIC PANEL
Albumin: 2.6 g/dL — ABNORMAL LOW (ref 3.5–5.2)
BUN: 41 mg/dL — ABNORMAL HIGH (ref 6–23)
CO2: 28 mEq/L (ref 19–32)
Chloride: 105 mEq/L (ref 96–112)
Creatinine, Ser: 0.8 mg/dL (ref 0.50–1.10)
GFR calc non Af Amer: 67 mL/min — ABNORMAL LOW (ref >90)
Total Bilirubin: 0.2 mg/dL — ABNORMAL LOW (ref 0.3–1.2)

## 2013-03-22 LAB — IRON AND TIBC
Saturation Ratios: 16 % — ABNORMAL LOW (ref 20–55)
UIBC: 181 ug/dL (ref 125–400)

## 2013-03-22 LAB — RETICULOCYTES
RBC.: 2.09 MIL/uL — ABNORMAL LOW (ref 3.87–5.11)
Retic Ct Pct: 6.6 % — ABNORMAL HIGH (ref 0.4–3.1)

## 2013-03-22 LAB — URINALYSIS, ROUTINE W REFLEX MICROSCOPIC
Nitrite: NEGATIVE
Specific Gravity, Urine: 1.016 (ref 1.005–1.030)
pH: 6 (ref 5.0–8.0)

## 2013-03-22 LAB — CBC
HCT: 19.5 % — ABNORMAL LOW (ref 36.0–46.0)
Hemoglobin: 6.3 g/dL — CL (ref 12.0–15.0)
MCH: 29.7 pg (ref 26.0–34.0)
MCHC: 32.3 g/dL (ref 30.0–36.0)

## 2013-03-22 LAB — MAGNESIUM: Magnesium: 2.5 mg/dL (ref 1.5–2.5)

## 2013-03-22 LAB — LACTATE DEHYDROGENASE: LDH: 108 U/L (ref 94–250)

## 2013-03-22 LAB — CORTISOL: Cortisol, Plasma: 28.3 ug/dL

## 2013-03-22 LAB — HAPTOGLOBIN: Haptoglobin: 164 mg/dL (ref 45–215)

## 2013-03-22 MED ORDER — ATENOLOL 25 MG PO TABS
25.0000 mg | ORAL_TABLET | Freq: Every day | ORAL | Status: DC
  Administered 2013-03-23 – 2013-03-25 (×3): 25 mg via ORAL
  Filled 2013-03-22 (×3): qty 1

## 2013-03-22 MED ORDER — SODIUM CHLORIDE 0.9 % IV SOLN: INTRAVENOUS | Status: DC

## 2013-03-22 MED ORDER — CIPROFLOXACIN HCL 500 MG PO TABS
500.0000 mg | ORAL_TABLET | Freq: Two times a day (BID) | ORAL | Status: DC
  Administered 2013-03-22 – 2013-03-25 (×7): 500 mg via ORAL
  Filled 2013-03-22 (×9): qty 1

## 2013-03-22 MED ORDER — ASPIRIN EC 81 MG PO TBEC
81.0000 mg | DELAYED_RELEASE_TABLET | Freq: Every day | ORAL | Status: DC
  Filled 2013-03-22: qty 1

## 2013-03-22 MED ORDER — ATENOLOL 50 MG PO TABS
50.0000 mg | ORAL_TABLET | Freq: Every day | ORAL | Status: DC
  Administered 2013-03-22: 50 mg via ORAL
  Filled 2013-03-22: qty 1

## 2013-03-22 NOTE — Progress Notes (Signed)
CRITICAL VALUE ALERT  Critical value received:  HGB 6.3  Date of notification:  03/22/13  Time of notification:  0740  Critical value read back: yes  Nurse who received alert:  Lenna Gilford, RN  MD notified (1st page):  Dr. Mahala Menghini  Time of first page:  0745  MD notified (2nd page): N/A  Time of second page: N/A  Responding MD:  Dr. Mahala Menghini  Time MD responded:  [phone]

## 2013-03-22 NOTE — Progress Notes (Signed)
CRITICAL VALUE ALERT  Critical value received:  hgb 6.3 and plt. 186  Date of notification:  03/22/2013  Time of notification:  0720  Critical value read back:yes  Nurse who received alert:  Dorna Leitz RN  MD notified (1st page):  Samtani  Time of first page:  0721  MD notified (2nd page):  Time of second page:  Responding MD:  Mahala Menghini  Time MD responded:  [phone]  Pt has no signs of active bleeding. MD wants to redraw labs. Will report to dayshift RN.

## 2013-03-22 NOTE — Progress Notes (Signed)
PROGRESS NOTE  [name] ZOX:[id number] DOB: 22-Jan-1930 DOA: 03/21/2013 PCP: Kaleen Mask, MD  Brief narrative: 79 year old [place] female admitted 03/21/2013 with malaise, hypokalemia weakness and syncopal episodes. Baseline hemoglobin appears to be about 12 and this dropped to 6.3.    Past medical history-As per Problem list Chart reviewed as below-  Admission 02/17/2013 for nausea vomiting and poor by mouth intake-noted possible GI illness at her church   Admission 09/11/2007 with pyelonephritis, hyponatremia, thyroid nodule-on that admission was anemic to 6.2 as well-colonoscopy was recommended as an outpatient  Consultants:  Gastroenterology  Procedures:  None  Antibiotics:  None   Subjective  Slightly better than before. Has some epigastric discomfort but no burning no nausea no vomiting Meds dark stool tarry stool hematemesis or melena Does not know whether she 7 had a colonoscopy in the past-cannot remember Denies headaches Was dizzy when getting up to the bathroom earlier but now feels a little better. Has tolerated some small amount of breakfast this morning Denies chest pain shortness of breath   Objective    Interim History: none  Telemetry:    Objective: Filed Vitals:   03/21/13 2200 03/21/13 2322 03/22/13 0004 03/22/13 0500  BP: 118/62 101/54 108/28 120/48  Pulse: 77 73 78 72  Temp:   99 F (37.2 C) 98.4 F (36.9 C)  TempSrc:   Oral Oral  Resp: 23 20 18 17   Height:    5\' 2"  (1.575 m)  Weight:   49.261 kg (108 lb 9.6 oz)   SpO2: 99% 98% 96% 97%    Intake/Output Summary (Last 24 hours) at 03/22/13 0839 Last data filed at 03/21/13 2112  Gross per 24 hour  Intake   1000 ml  Output      0 ml  Net   1000 ml    Exam:  General: Pleasant oriented no pallor no icterus no rash Cardiovascular: S1-S2 no murmur rub or gallop Respiratory: Clinically clear Abdomen: No epigastric pain or tenderness Skin no lower extremity edema Neuro  grossly intact  Data Reviewed: Basic Metabolic Panel:  Recent Labs Lab 03/21/13 1851 03/22/13 0550  NA 135 138  K 2.7* 2.9*  CL 92* 105  CO2 27 28  GLUCOSE 156* 94  BUN 55* 41*  CREATININE 1.09 0.80  CALCIUM 8.6 7.9*  MG 2.2 2.5   Liver Function Tests:  Recent Labs Lab 03/21/13 1851 03/22/13 0550  AST 20 11  ALT 15 9  ALKPHOS 59 47  BILITOT 0.2* 0.2*  PROT 6.0 4.9*  ALBUMIN 3.0* 2.6*    Recent Labs Lab 03/21/13 1851  LIPASE 24   No results found for this basename: AMMONIA,  in the last 168 hours CBC:  Recent Labs Lab 03/21/13 1851 03/22/13 0740  WBC 12.9* 9.4  NEUTROABS 11.0*  --   HGB 8.0* 6.3*  HCT 24.3* 19.5*  MCV 91.4 92.0  PLT 270 186   Cardiac Enzymes: No results found for this basename: CKTOTAL, CKMB, CKMBINDEX, TROPONINI,  in the last 168 hours BNP: No components found with this basename: POCBNP,  CBG: No results found for this basename: GLUCAP,  in the last 168 hours  No results found for this or any previous visit (from the past 240 hour(s)).   Studies:              All Imaging reviewed and is as per above notation   Scheduled Meds: . aspirin EC  81 mg Oral Daily  . atenolol  50 mg Oral Daily  .  ciprofloxacin  500 mg Oral BID  . sodium chloride  3 mL Intravenous Q12H   Continuous Infusions: . sodium chloride 100 mL/hr at 03/22/13 0040  . sodium chloride       Assessment/Plan: 1. ? Syncopal episode-probably volume depletion secondary to poor by mouth intake +/- Atenolol effect/UTI? Telemetry shows normal sinus rhythm therefore if negative on telemetry would discontinue the same.  Cont. Saline 100 cc/hr.  2. Anemia-unclear etiology-anemia panel pending, LDH and haptoglobin also pending Hemoccult pending. Reticulocyte count 6.6 and therefore very responsive Discussed with Dr. Madilyn Fireman of gastroenterology who states to monitor and if needed call him back if Hemoccult was positive- patient has never had colonoscopy or endoscopy in the  past 3.  Escherichia coli UTI-  currently on oral ciprofloxacin. Continue the same. Will escalate therapy if febrile or has leukocytosis 4. Weakness/fatigue-probably secondary to hyperkalemia/anemia, cortisol and TSH ordered are still pending 5. Abdominal pain-unclear etiology-probably related to potential minimal gastritis and she is on aspirin. Lipase was negative. See #2 6. History of hypertension-adjusted atenolol to 25 mg daily from 100 mg-discontinued Maxzide given volume depletion hypokalemia 7. Hypokalemia-probably secondary to diuretic and poor by mouth intake 8. Volume depletion-baseline Bun/ creatinine of 30-1-she probably would benefit from not HCTZ in the future 9. Impaired glucose tolerance but sugars 100 to 150s in the past-monitor  Code Status: Full Family Communication: None at bedside old husband but not available on soft Disposition Plan: Inpatient   Pleas Koch, MD  [facility] Pager [phone] 03/22/2013, 8:39 AM    LOS: 1 day

## 2013-03-23 LAB — TYPE AND SCREEN: Unit division: 0

## 2013-03-23 LAB — MAGNESIUM: Magnesium: 2 mg/dL (ref 1.5–2.5)

## 2013-03-23 LAB — BASIC METABOLIC PANEL
GFR calc Af Amer: >90 mL/min (ref >90)
GFR calc non Af Amer: 79 mL/min — ABNORMAL LOW (ref >90)
Glucose, Bld: 89 mg/dL (ref 70–99)
Potassium: 2.4 mEq/L — CL (ref 3.5–5.1)
Sodium: 137 mEq/L (ref 135–145)

## 2013-03-23 LAB — CBC
Hemoglobin: 9.6 g/dL — ABNORMAL LOW (ref 12.0–15.0)
MCHC: 34 g/dL (ref 30.0–36.0)
Platelets: 179 10*3/uL (ref 150–400)

## 2013-03-23 MED ORDER — ENSURE COMPLETE PO LIQD
237.0000 mL | Freq: Two times a day (BID) | ORAL | Status: DC
  Administered 2013-03-23 – 2013-03-25 (×4): 237 mL via ORAL

## 2013-03-23 MED ORDER — POTASSIUM CHLORIDE 10 MEQ/100ML IV SOLN
10.0000 meq | INTRAVENOUS | Status: DC
  Filled 2013-03-23 (×6): qty 100

## 2013-03-23 MED ORDER — POTASSIUM CHLORIDE CRYS ER 20 MEQ PO TBCR
40.0000 meq | EXTENDED_RELEASE_TABLET | Freq: Three times a day (TID) | ORAL | Status: DC
  Administered 2013-03-23 – 2013-03-25 (×6): 40 meq via ORAL
  Filled 2013-03-23: qty 2
  Filled 2013-03-23: qty 1
  Filled 2013-03-23 (×7): qty 2

## 2013-03-23 MED ORDER — SODIUM CHLORIDE 0.9 % IV SOLN
INTRAVENOUS | Status: DC
  Administered 2013-03-23 – 2013-03-24 (×2): via INTRAVENOUS

## 2013-03-23 MED ORDER — POTASSIUM CHLORIDE CRYS ER 20 MEQ PO TBCR
40.0000 meq | EXTENDED_RELEASE_TABLET | Freq: Two times a day (BID) | ORAL | Status: DC
  Administered 2013-03-23: 40 meq via ORAL
  Filled 2013-03-23: qty 2

## 2013-03-23 NOTE — Progress Notes (Signed)
PROGRESS NOTE  [name] WUJ:[id number] DOB: 04/26/1930 DOA: 03/21/2013 PCP: Kaleen Mask, MD  Brief narrative: 79 year old [place] female admitted 03/21/2013 with malaise, hypokalemia weakness and syncopal episodes. Baseline hemoglobin appears to be about 12 and this dropped to 6.3.    Past medical history-As per Problem list Chart reviewed as below-  Admission 02/17/2013 for nausea vomiting and poor by mouth intake-noted possible GI illness at her [facility]   Admission 09/11/2007 with pyelonephritis, hyponatremia, thyroid nodule-on that admission was anemic to 6.2 as well-colonoscopy was recommended as an outpatient  Consultants:  Gastroenterology-fall consulted Dr. Madilyn Fireman 6/1  Nephrology-fall consulted Dr. Cherie Ouch 6/2  Procedures:  S/p 2 units packed red blood cells 6/1/  Antibiotics:  None   Subjective  Slightly better than before. Weakness seems to be resolving. Denies nausea vomiting chest pain blurred vision shortness of breath   Objective    Interim History: none  Telemetry:  Normal sinus  Objective: Filed Vitals:   03/22/13 1715 03/22/13 1835 03/22/13 2100 03/23/13 0500  BP: 148/56 152/61 130/54 166/60  Pulse: 77 78 77 81  Temp: 98.9 F (37.2 C) 99.3 F (37.4 C) 98.8 F (37.1 C) 98.6 F (37 C)  TempSrc: Oral Oral    Resp: 18 18 16 16   Height:      Weight:      SpO2:   96% 100%    Intake/Output Summary (Last 24 hours) at 03/23/13 1133 Last data filed at 03/23/13 1040  Gross per 24 hour  Intake   1763 ml  Output      0 ml  Net   1763 ml    Exam:  General: Pleasant oriented no pallor no icterus no rash Cardiovascular: S1-S2 no murmur rub or gallop Respiratory: Clinically clear Abdomen: No epigastric pain or tenderness Skin no lower extremity edema Neuro grossly intact  Data Reviewed: Basic Metabolic Panel:  Recent Labs Lab 03/21/13 1851 03/22/13 0550 03/23/13 0526  NA 135 138 137  K 2.7* 2.9* 2.4*  CL 92* 105 102  CO2  27 28 30   GLUCOSE 156* 94 89  BUN 55* 41* 19  CREATININE 1.09 0.80 0.70  CALCIUM 8.6 7.9* 8.0*  MG 2.2 2.5  --    Liver Function Tests:  Recent Labs Lab 03/21/13 1851 03/22/13 0550  AST 20 11  ALT 15 9  ALKPHOS 59 47  BILITOT 0.2* 0.2*  PROT 6.0 4.9*  ALBUMIN 3.0* 2.6*    Recent Labs Lab 03/21/13 1851  LIPASE 24   No results found for this basename: AMMONIA,  in the last 168 hours CBC:  Recent Labs Lab 03/21/13 1851 03/22/13 0740 03/23/13 0526  WBC 12.9* 9.4 9.3  NEUTROABS 11.0*  --   --   HGB 8.0* 6.3* 9.6*  HCT 24.3* 19.5* 28.2*  MCV 91.4 92.0 89.5  PLT 270 186 179   Cardiac Enzymes: No results found for this basename: CKTOTAL, CKMB, CKMBINDEX, TROPONINI,  in the last 168 hours BNP: No components found with this basename: POCBNP,  CBG: No results found for this basename: GLUCAP,  in the last 168 hours  No results found for this or any previous visit (from the past 240 hour(s)).   Studies:              All Imaging reviewed and is as per above notation   Scheduled Meds: . atenolol  25 mg Oral Daily  . ciprofloxacin  500 mg Oral BID  . potassium chloride  40 mEq Oral TID  . sodium  chloride  3 mL Intravenous Q12H   Continuous Infusions: . sodium chloride       Assessment/Plan: 1. ? Syncopal episode-probably volume depletion secondary to poor by mouth intake +/- Atenolol effect/UTI? Telemetry shows normal sinus rhythm therefore if negative on telemetry would discontinue the same.  Cont. Saline 75 cc per hour 2. Anemia-unclear etiology-anemia panel pending, LDH and haptoglobin also pending Hemoccult pending. Reticulocyte count 6.6 and therefore very responsive Discussed with Dr. Madilyn Fireman of gastroenterology who states to monitor and if needed call him back if Hemoccult was positive [no stool yet]- patient has never had colonoscopy or endoscopy in the past  3.  Escherichia coli UTI-  currently on oral ciprofloxacin. Continue the same. Will escalate therapy  if febrile or has leukocytosis 4. Weakness/fatigue-probably secondary to hypokalemia/anemia, cortisol 20.3 and TSH 0.77 [rules out thyrotoxic periodic paralysis] 5. Abdominal pain-unclear etiology-but resolved currently. Lipase was negative. See #2 6. History of hypertension-adjusted atenolol to 25 mg daily from 100 mg-discontinued Maxzide given volume depletion hypokalemia 7. Hypokalemia-probably secondary to diuretic and poor by mouth intake-dosed with nephrology 03/23/13-recommends renin aldosterone ratio in the morning. Checking magnesium and replacing if needed 8. Volume depletion-baseline Bun/ creatinine of 30-1-she probably would benefit from not HCTZ in the future 9. Impaired glucose tolerance but sugars 100 to 150s in the past-monitor  Code Status: Full Family Communication: Discussed with husband in detail at bedside 03/23/13 Disposition Plan: Inpatient   Pleas Koch, MD  [facility] Pager [phone] 03/23/2013, 11:33 AM    LOS: 2 days

## 2013-03-23 NOTE — Progress Notes (Signed)
Potassium level sent to MD on call via amion.

## 2013-03-23 NOTE — Progress Notes (Signed)
INITIAL NUTRITION ASSESSMENT  DOCUMENTATION CODES Per approved criteria  -Severe malnutrition in the context of acute illness or injury   INTERVENTION:  Ensure Complete twice daily (350 kcals, 13 gm protein per 8 fl oz bottle) RD to follow for nutrition care plan  NUTRITION DIAGNOSIS: Inadequate oral intake related to poor appetite as evidenced by PO intake 0-50%  Goal: Oral intake with meals & supplements to meet >/= 90% of estimated nutrition needs  Monitor:  PO & supplemental intake, weight, labs, I/O's  Reason for Assessment: Malnutrition Screening Tool Report  79 y.o. female  Admitting Dx: weakness  ASSESSMENT: Patient with past medical history significant for hypertension, hyperlipidemia, hypokalemia, recent urinary tract infection presented with a chief complaint of ongoing weakness; + intermittent nausea and vomiting; had a syncopal episode at home and was unconscious for about 5 minutes.  RD spoke with patient's husband at bedside; states his wife's appetite has been poor for the past month; reports she mostly "grazes" during the day; per records, patient has lost 7% since end of April 2014 (severe for time frame) ---> amenable to Strawberry Ensure supplements ---> RD to order.  Patient meets criteria for severe malnutrition in the context of acute illness or injury given < 50% intake of estimated energy requirement for > 5 days and 7% weight loss x 1 month.  Height: Ht Readings from Last 1 Encounters:  03/22/13 5\' 2"  (1.575 m)    Weight: Wt Readings from Last 1 Encounters:  03/22/13 108 lb 9.6 oz (49.261 kg)    Ideal Body Weight: 110 lb  % Ideal Body Weight: 98%  Wt Readings from Last 10 Encounters:  03/22/13 108 lb 9.6 oz (49.261 kg)  02/17/13 117 lb 3 oz (53.156 kg)    Usual Body Weight: 117 lb  % Usual Body Weight: 92%  BMI:  Body mass index is 19.86 kg/(m^2).  Estimated Nutritional Needs: Kcal: 1300-1500 Protein: 65-75 gm Fluid: >/= 1.5  L  Skin: Intact  Diet Order: General  EDUCATION NEEDS: -No education needs identified at this time   Intake/Output Summary (Last 24 hours) at 03/23/13 1506 Last data filed at 03/23/13 1200  Gross per 24 hour  Intake 1130.5 ml  Output      0 ml  Net 1130.5 ml    Labs:   Recent Labs Lab 03/21/13 1851 03/22/13 0550 03/23/13 0526  NA 135 138 137  K 2.7* 2.9* 2.4*  CL 92* 105 102  CO2 27 28 30   BUN 55* 41* 19  CREATININE 1.09 0.80 0.70  CALCIUM 8.6 7.9* 8.0*  MG 2.2 2.5 2.0  GLUCOSE 156* 94 89    Scheduled Meds: . atenolol  25 mg Oral Daily  . ciprofloxacin  500 mg Oral BID  . potassium chloride  40 mEq Oral TID  . sodium chloride  3 mL Intravenous Q12H    Continuous Infusions: . sodium chloride 75 mL/hr at 03/23/13 1216    Past Medical History  Diagnosis Date  . Hypertension   . High cholesterol   . Cardiomegaly     Hattie Perch 02/17/2013    Past Surgical History  Procedure Laterality Date  . No past surgeries      Maureen Chatters, RD, LDN Pager #: [phone] After-Hours Pager #: [phone]

## 2013-03-24 DIAGNOSIS — E785 Hyperlipidemia, unspecified: Secondary | ICD-10-CM

## 2013-03-24 DIAGNOSIS — E43 Unspecified severe protein-calorie malnutrition: Secondary | ICD-10-CM | POA: Insufficient documentation

## 2013-03-24 DIAGNOSIS — E876 Hypokalemia: Secondary | ICD-10-CM

## 2013-03-24 DIAGNOSIS — I10 Essential (primary) hypertension: Secondary | ICD-10-CM

## 2013-03-24 LAB — CBC
Hemoglobin: 10 g/dL — ABNORMAL LOW (ref 12.0–15.0)
MCH: 31.1 pg (ref 26.0–34.0)
RBC: 3.22 MIL/uL — ABNORMAL LOW (ref 3.87–5.11)
WBC: 10.3 10*3/uL (ref 4.0–10.5)

## 2013-03-24 LAB — BASIC METABOLIC PANEL
GFR calc non Af Amer: 80 mL/min — ABNORMAL LOW (ref >90)
Glucose, Bld: 101 mg/dL — ABNORMAL HIGH (ref 70–99)
Potassium: 3.5 mEq/L (ref 3.5–5.1)
Sodium: 136 mEq/L (ref 135–145)

## 2013-03-24 MED ORDER — POLYETHYLENE GLYCOL 3350 17 G PO PACK
17.0000 g | PACK | Freq: Every day | ORAL | Status: DC
  Administered 2013-03-24 – 2013-03-25 (×2): 17 g via ORAL
  Filled 2013-03-24 (×2): qty 1

## 2013-03-24 NOTE — Care Management Note (Unsigned)
    Page 1 of 1   03/24/2013     10:36:14 AM   CARE MANAGEMENT NOTE 03/24/2013  Patient:  [name], [name]   Account Number:  [id number]  Date Initiated:  03/24/2013  Documentation initiated by:  GRAVES-BIGELOW,Georgi Tuel  Subjective/Objective Assessment:   Pt admitted with weakness and low K.     Action/Plan:   CM will continue to monitor for dispsosition needs.   Anticipated DC Date:  03/26/2013   Anticipated DC Plan:  HOME W HOME HEALTH SERVICES         Choice offered to / List presented to:             Status of service:  In process, will continue to follow Medicare Important Message given?   (If response is "NO", the following Medicare IM given date fields will be blank) Date Medicare IM given:   Date Additional Medicare IM given:    Discharge Disposition:    Per UR Regulation:  Reviewed for med. necessity/level of care/duration of stay  If discussed at Long Length of Stay Meetings, dates discussed:    Comments:

## 2013-03-24 NOTE — Progress Notes (Signed)
PROGRESS NOTE  [name] YQM:[id number] DOB: Jul 11, 1930 DOA: 03/21/2013 PCP: Kaleen Mask, MD  Brief narrative: 79 year old [place] female admitted 03/21/2013 with malaise, hypokalemia weakness and syncopal episodes. Baseline hemoglobin appears to be about 12 and this dropped to 6.3.    Past medical history-As per Problem list Chart reviewed as below-  Admission 02/17/2013 for nausea vomiting and poor by mouth intake-noted possible GI illness at her church   Admission 09/11/2007 with pyelonephritis, hyponatremia, thyroid nodule-on that admission was anemic to 6.2 as well-colonoscopy was recommended as an outpatient  Consultants:  Gastroenterology-phoneconsulted Dr. Madilyn Fireman 6/1  Nephrology-phone consulted Dr. Cherie Ouch 6/2  Procedures:  S/p 2 units packed red blood cells 6/1/  Antibiotics:  None   Subjective  Slightly better than before.  Denies nausea vomiting chest pain blurred vision shortness of breath Still not really eating or drinking much but s little stroner   Objective    Interim History: none  Telemetry:  Normal sinus  Objective: Filed Vitals:   03/23/13 1400 03/23/13 2100 03/24/13 0500 03/24/13 1419  BP: 139/43 170/69 157/78 151/59  Pulse: 77 82 82 79  Temp: 98.8 F (37.1 C) 98.2 F (36.8 C) 98.2 F (36.8 C) 98.5 F (36.9 C)  TempSrc:    Oral  Resp: 16 18 16 18   Height:      Weight:      SpO2: 97% 98% 98% 98%   No intake or output data in the 24 hours ending 03/24/13 1601  Exam:  General: Pleasant oriented no pallor no icterus no rash Cardiovascular: S1-S2 no murmur rub or gallop Respiratory: Clinically clear Abdomen: No epigastric pain or tenderness Skin no lower extremity edema Neuro grossly intact  Data Reviewed: Basic Metabolic Panel:  Recent Labs Lab 03/21/13 1851 03/22/13 0550 03/23/13 0526 03/24/13 0508  NA 135 138 137 136  K 2.7* 2.9* 2.4* 3.5  CL 92* 105 102 100  CO2 27 28 30 28   GLUCOSE 156* 94 89 101*  BUN  55* 41* 19 12  CREATININE 1.09 0.80 0.70 0.67  CALCIUM 8.6 7.9* 8.0* 8.7  MG 2.2 2.5 2.0  --    Liver Function Tests:  Recent Labs Lab 03/21/13 1851 03/22/13 0550  AST 20 11  ALT 15 9  ALKPHOS 59 47  BILITOT 0.2* 0.2*  PROT 6.0 4.9*  ALBUMIN 3.0* 2.6*    Recent Labs Lab 03/21/13 1851  LIPASE 24   No results found for this basename: AMMONIA,  in the last 168 hours CBC:  Recent Labs Lab 03/21/13 1851 03/22/13 0740 03/23/13 0526 03/24/13 0508  WBC 12.9* 9.4 9.3 10.3  NEUTROABS 11.0*  --   --   --   HGB 8.0* 6.3* 9.6* 10.0*  HCT 24.3* 19.5* 28.2* 28.6*  MCV 91.4 92.0 89.5 88.8  PLT 270 186 179 190   Cardiac Enzymes: No results found for this basename: CKTOTAL, CKMB, CKMBINDEX, TROPONINI,  in the last 168 hours BNP: No components found with this basename: POCBNP,  CBG: No results found for this basename: GLUCAP,  in the last 168 hours  No results found for this or any previous visit (from the past 240 hour(s)).   Studies:              All Imaging reviewed and is as per above notation   Scheduled Meds: . atenolol  25 mg Oral Daily  . ciprofloxacin  500 mg Oral BID  . feeding supplement  237 mL Oral BID BM  . polyethylene glycol  17  g Oral Daily  . potassium chloride  40 mEq Oral TID  . sodium chloride  3 mL Intravenous Q12H   Continuous Infusions: . sodium chloride 75 mL/hr at 03/23/13 1216     Assessment/Plan: 1. ? Syncopal episode-probably volume depletion secondary to poor by mouth intake +/- Atenolol effect/UTI? Telemetry shows normal sinus rhythm therefore if negative on telemetry would discontinue the same.  Cont. Saline 75 cc per hour. 2. Anemia-unclear etiology-anemia panel pending, LDH and haptoglobin also pending Hemoccult pending. Reticulocyte count 6.6 and therefore very responsive.  Hb post transfusion maintained at 10 hb.  Discussed with Dr. Madilyn Fireman of gastroenterology who states to monitor and if needed call him back if Hemoccult was positive  [no stool yet]- patient has never had colonoscopy or endoscopy in the past   3. Escherichia coli cystitis- currently on oral ciprofloxacin, stop date 03/25/13 4. Weakness/fatigue-probably secondary to hypokalemia/anemia, cortisol 20.3 and TSH 0.77 [rules out thyrotoxic periodic paralysis] 5. Abdominal pain-unclear etiology-but resolved currently. Lipase was negative. See #2 6. History of hypertension-adjusted atenolol to 25 mg daily from 100 mg-discontinued Maxzide given volume depletion hypokalemia 7. Hypokalemia-probably secondary to diuretic and poor by mouth intake-dosed with nephrology 03/23/13-recommends renin aldosterone ratio in the morning. Magnesium was wnl on 6/2.  Await labs and further work-up. 8. Volume depletion-baseline Bun/ creatinine of 30-1-she probably would benefit from not HCTZ in the future. 9. Impaired glucose tolerance but sugars 100 to 150s in the past-monitor.  Code Status: Full Family Communication: Discussed with husband in detail at bedside 03/23/13 Disposition Plan: Inpatient   Pleas Koch, MD  [facility] Pager [phone] 03/24/2013, 4:01 PM    LOS: 3 days

## 2013-03-24 NOTE — Progress Notes (Signed)
UR Completed Carlas Vandyne Graves-Bigelow, RN,BSN [phone]  

## 2013-03-25 LAB — CBC
HCT: 30.4 % — ABNORMAL LOW (ref 36.0–46.0)
Hemoglobin: 10.4 g/dL — ABNORMAL LOW (ref 12.0–15.0)
MCHC: 34.2 g/dL (ref 30.0–36.0)
RBC: 3.37 MIL/uL — ABNORMAL LOW (ref 3.87–5.11)
WBC: 10.7 10*3/uL — ABNORMAL HIGH (ref 4.0–10.5)

## 2013-03-25 LAB — OCCULT BLOOD X 1 CARD TO LAB, STOOL: Fecal Occult Bld: POSITIVE — AB

## 2013-03-25 MED ORDER — POTASSIUM CHLORIDE ER 20 MEQ PO TBCR: 10.0000 meq | EXTENDED_RELEASE_TABLET | Freq: Every day | ORAL | Status: DC

## 2013-03-25 NOTE — Discharge Summary (Signed)
Physician Discharge Summary  Beyounce Dickens ZOX:[id number] DOB: 07-Jun-1930 DOA: 03/21/2013  PCP: Kaleen Mask, MD  Admit date: 03/21/2013 Discharge date: 03/25/2013  Time spent: Greater than 30 minutes  Recommendations for Outpatient Follow-up:  -Advised to followup with primary care provider in one week. -At time of hospital followup will need a be met to followup on her potassium levels.   Discharge Diagnoses:  Active Problems:   Hypokalemia   Hypertension   Hyperlipemia   Protein-calorie malnutrition, severe   Discharge Condition: Stable and improved  Filed Weights   03/22/13 0004  Weight: 49.261 kg (108 lb 9.6 oz)    History of present illness:  Patient is an 79 y.o. female has a past medical history significant for hypertension, hyperlipidemia, hypokalemia (was hospitalized about a month ago), recent urinary tract infection diagnosed by her PCP 3 days ago (E coli sensitive to Ciprofloxacin), presents with a chief complaint of ongoing weakness. Since her last hospitalization, she continued to feel weak with intermittent nausea and vomiting. Her last vomiting episode was about 5 days ago. She denies any blood in her emesis, denies any blood in her stool or dark tarry stools. She denies any chest pain or breathing difficulties. She endorses poor appetite and generalized fatigue. She was seen by her PCP about 4 days ago with ongoing symptoms. She had a syncopal episode at home this afternoon reported by her husband, was unconscious for about 5 minutes and her breathing was labored. There was no reported postictal period. She denies any headaches. She denies any numbness or tingling in her arms or feet, she denies any focal weakness. In the emergency room, patient is orthostatic, initial laboratory reveals a hemoglobin of 8.0 which is lower than her normal, as well as a potassium of 2.7. Magnesium was normal at 2.2. She has mild leukocytosis of 12.9. We are asked to admit her for further  evaluation and management.   Hospital Course:  Syncopal episode -Present secondary to volume depletion secondary to poor by mouth intake in addition to electrolyte abnormalities. -No further syncopal episodes while in the hospital. -Telemetry has not shown any arrhythmias.  Anemia -She did have positive Hemoccults. -Her hemoglobin dropped to about 6.3 at which point she was given 2 units of PRBCs. -Hemoglobin has been stable at around 10.5 since transfusion. -Will need to followup with Dr. Madilyn Fireman, GI as an outpatient (he was curbside consulted by my partner and recommended outpatient followup).  Escherichia coli UTI -Has completed 7 days of Cipro. -No further antibiotics at time of discharge.  Hypokalemia -Probably related to diuretic use on top of poor by mouth intake. -We'll discontinue diuretic. We'll also discharge on a low dose of daily potassium supplementation of 20 mEq. Has been advised to followup with her primary care provider in one week for repeat be met.  Procedures:  None  Consultations:  None  Discharge Instructions  Discharge Orders   Future Orders Complete By Expires     Discontinue IV  As directed     Increase activity slowly  As directed         Medication List    STOP taking these medications       aspirin EC 81 MG tablet     ciprofloxacin 500 MG tablet  Commonly known as:  CIPRO     triamterene-hydrochlorothiazide 75-50 MG per tablet  Commonly known as:  MAXZIDE      TAKE these medications       atenolol 100 MG tablet  Commonly  known as:  TENORMIN  Take 50 mg by mouth daily.     Potassium Chloride ER 20 MEQ Tbcr  Take 10 mEq by mouth daily.       No Known Allergies     Follow-up Information   Follow up with Kaleen Mask, MD. Schedule an appointment as soon as possible for a visit in 1 week.   Contact information:   [address] [place] [address] [phone]        The results of significant  diagnostics from this hospitalization (including imaging, microbiology, ancillary and laboratory) are listed below for reference.    Significant Diagnostic Studies: No results found.  Microbiology: No results found for this or any previous visit (from the past 240 hour(s)).   Labs: Basic Metabolic Panel:  Recent Labs Lab 03/21/13 1851 03/22/13 0550 03/23/13 0526 03/24/13 0508  NA 135 138 137 136  K 2.7* 2.9* 2.4* 3.5  CL 92* 105 102 100  CO2 27 28 30 28   GLUCOSE 156* 94 89 101*  BUN 55* 41* 19 12  CREATININE 1.09 0.80 0.70 0.67  CALCIUM 8.6 7.9* 8.0* 8.7  MG 2.2 2.5 2.0  --    Liver Function Tests:  Recent Labs Lab 03/21/13 1851 03/22/13 0550  AST 20 11  ALT 15 9  ALKPHOS 59 47  BILITOT 0.2* 0.2*  PROT 6.0 4.9*  ALBUMIN 3.0* 2.6*    Recent Labs Lab 03/21/13 1851  LIPASE 24   No results found for this basename: AMMONIA,  in the last 168 hours CBC:  Recent Labs Lab 03/21/13 1851 03/22/13 0740 03/23/13 0526 03/24/13 0508 03/25/13 0955  WBC 12.9* 9.4 9.3 10.3 10.7*  NEUTROABS 11.0*  --   --   --   --   HGB 8.0* 6.3* 9.6* 10.0* 10.4*  HCT 24.3* 19.5* 28.2* 28.6* 30.4*  MCV 91.4 92.0 89.5 88.8 90.2  PLT 270 186 179 190 239   Cardiac Enzymes: No results found for this basename: CKTOTAL, CKMB, CKMBINDEX, TROPONINI,  in the last 168 hours BNP: BNP (last 3 results) No results found for this basename: PROBNP,  in the last 8760 hours CBG: No results found for this basename: GLUCAP,  in the last 168 hours     Signed:  Chaya Jan  [facility] Pager: [phone] 03/25/2013, 11:37 AM

## 2013-03-25 NOTE — Progress Notes (Signed)
D/c orders received;IV removed with gauze on, pt remains in stable condition, pt meds and instructions reviewed and given to pt; pt d/c to home 

## 2013-03-27 LAB — ALDOSTERONE + RENIN ACTIVITY W/ RATIO: PRA LC/MS/MS: 0.28 ng/mL/h (ref 0.25–5.82)

## 2013-11-25 ENCOUNTER — Other Ambulatory Visit: Payer: Self-pay | Admitting: Family Medicine

## 2013-11-25 ENCOUNTER — Ambulatory Visit
Admission: RE | Admit: 2013-11-25 | Discharge: 2013-11-25 | Disposition: A | Discharge status: Home / Self Care | Payer: Medicare Other | Source: Ambulatory Visit | Attending: Family Medicine | Admitting: Family Medicine

## 2013-11-25 DIAGNOSIS — M549 Dorsalgia, unspecified: Secondary | ICD-10-CM

## 2014-01-05 ENCOUNTER — Encounter (HOSPITAL_COMMUNITY): Payer: Self-pay | Admitting: Emergency Medicine

## 2014-01-05 ENCOUNTER — Emergency Department (HOSPITAL_COMMUNITY)
Admission: EM | Admit: 2014-01-05 | Discharge: 2014-01-05 | Disposition: A | Discharge status: Home / Self Care | Payer: Medicare Other | Attending: Emergency Medicine | Admitting: Emergency Medicine

## 2014-01-05 ENCOUNTER — Emergency Department (HOSPITAL_COMMUNITY): Payer: Medicare Other

## 2014-01-05 DIAGNOSIS — E7800 Pure hypercholesterolemia, unspecified: Secondary | ICD-10-CM | POA: Insufficient documentation

## 2014-01-05 DIAGNOSIS — F17200 Nicotine dependence, unspecified, uncomplicated: Secondary | ICD-10-CM | POA: Insufficient documentation

## 2014-01-05 DIAGNOSIS — I10 Essential (primary) hypertension: Secondary | ICD-10-CM | POA: Insufficient documentation

## 2014-01-05 DIAGNOSIS — X58XXXA Exposure to other specified factors, initial encounter: Secondary | ICD-10-CM | POA: Insufficient documentation

## 2014-01-05 DIAGNOSIS — S32009A Unspecified fracture of unspecified lumbar vertebra, initial encounter for closed fracture: Secondary | ICD-10-CM | POA: Insufficient documentation

## 2014-01-05 DIAGNOSIS — N814 Uterovaginal prolapse, unspecified: Secondary | ICD-10-CM | POA: Insufficient documentation

## 2014-01-05 DIAGNOSIS — Y939 Activity, unspecified: Secondary | ICD-10-CM | POA: Insufficient documentation

## 2014-01-05 DIAGNOSIS — Z79899 Other long term (current) drug therapy: Secondary | ICD-10-CM | POA: Insufficient documentation

## 2014-01-05 DIAGNOSIS — Y929 Unspecified place or not applicable: Secondary | ICD-10-CM | POA: Insufficient documentation

## 2014-01-05 DIAGNOSIS — S32020A Wedge compression fracture of second lumbar vertebra, initial encounter for closed fracture: Secondary | ICD-10-CM

## 2014-01-05 LAB — BASIC METABOLIC PANEL
BUN: 22 mg/dL (ref 6–23)
CHLORIDE: 93 meq/L — AB (ref 96–112)
CO2: 27 mEq/L (ref 19–32)
CREATININE: 0.65 mg/dL (ref 0.50–1.10)
Calcium: 10 mg/dL (ref 8.4–10.5)
GFR calc non Af Amer: 80 mL/min — ABNORMAL LOW (ref >90)
Glucose, Bld: 129 mg/dL — ABNORMAL HIGH (ref 70–99)
POTASSIUM: 3.7 meq/L (ref 3.7–5.3)
SODIUM: 136 meq/L — AB (ref 137–147)

## 2014-01-05 LAB — CBC WITH DIFFERENTIAL/PLATELET
BASOS ABS: 0 10*3/uL (ref 0.0–0.1)
BASOS PCT: 0 % (ref 0–1)
Eosinophils Absolute: 0 10*3/uL (ref 0.0–0.7)
Eosinophils Relative: 0 % (ref 0–5)
HCT: 40.5 % (ref 36.0–46.0)
Hemoglobin: 14.1 g/dL (ref 12.0–15.0)
Lymphocytes Relative: 5 % — ABNORMAL LOW (ref 12–46)
Lymphs Abs: 0.8 10*3/uL (ref 0.7–4.0)
MCH: 30.8 pg (ref 26.0–34.0)
MCHC: 34.8 g/dL (ref 30.0–36.0)
MCV: 88.4 fL (ref 78.0–100.0)
Monocytes Absolute: 0.3 10*3/uL (ref 0.1–1.0)
Monocytes Relative: 2 % — ABNORMAL LOW (ref 3–12)
NEUTROS ABS: 13.3 10*3/uL — AB (ref 1.7–7.7)
NEUTROS PCT: 92 % — AB (ref 43–77)
PLATELETS: 255 10*3/uL (ref 150–400)
RBC: 4.58 MIL/uL (ref 3.87–5.11)
RDW: 12.7 % (ref 11.5–15.5)
WBC: 14.4 10*3/uL — AB (ref 4.0–10.5)

## 2014-01-05 MED ORDER — OXYCODONE-ACETAMINOPHEN 5-325 MG PO TABS: 1.0000 | ORAL_TABLET | Freq: Four times a day (QID) | ORAL | Status: DC | PRN

## 2014-01-05 MED ORDER — KETOROLAC TROMETHAMINE 60 MG/2ML IM SOLN
30.0000 mg | Freq: Once | INTRAMUSCULAR | Status: AC
Start: 2014-01-05 — End: 2014-01-05
  Administered 2014-01-05: 30 mg via INTRAMUSCULAR
  Filled 2014-01-05: qty 2

## 2014-01-05 MED ORDER — IBUPROFEN 400 MG PO TABS: 400.0000 mg | ORAL_TABLET | Freq: Four times a day (QID) | ORAL | Status: DC | PRN

## 2014-01-05 MED ORDER — OXYCODONE-ACETAMINOPHEN 5-325 MG PO TABS
1.0000 | ORAL_TABLET | Freq: Once | ORAL | Status: AC
  Administered 2014-01-05: 1 via ORAL
  Filled 2014-01-05: qty 1

## 2014-01-05 NOTE — ED Notes (Signed)
Patient transported to MRI 

## 2014-01-05 NOTE — ED Provider Notes (Addendum)
CSN: [id number]     Arrival date & time 01/05/14  1122 History   First MD Initiated Contact with Patient 01/05/14 1149     Chief Complaint  Patient presents with  . Back Pain     (Consider location/radiation/quality/duration/timing/severity/associated sxs/prior Treatment) HPI  This is an 80 year old female with history of hypertension, hypocholesterolemia who presents with back pain. Patient reports a 5 to six-week history of worsening lumbar pain. She did have a x-ray by her primary care physician and was told she had a compression fracture. She is taking Tylenol and tramadol. She states the tramadol makes her feel woozy. Over last 1-2 days she's had acute worsening of pain. She denies any difficulty peeing or breathing. She does note a "mass" in my vaginal area. She denies any weakness, numbness, or tingling down either leg. Patient rates pain at 9/10. It is nonradiating.  Pain is worse with movement.  Past Medical History  Diagnosis Date  . Hypertension   . High cholesterol   . Cardiomegaly     Hattie Perch 02/17/2013   Past Surgical History  Procedure Laterality Date  . No past surgeries     History reviewed. No pertinent family history. History  Substance Use Topics  . Smoking status: Current Every Day Smoker -- 0.12 packs/day for 60 years    Types: Cigarettes  . Smokeless tobacco: Never Used  . Alcohol Use: No   OB History   Grav Para Term Preterm Abortions TAB SAB Ect Mult Living                 Review of Systems  Constitutional: Negative for fever.  Respiratory: Negative for cough, chest tightness and shortness of breath.   Cardiovascular: Negative for chest pain.  Gastrointestinal: Negative for nausea, vomiting and abdominal pain.  Genitourinary: Negative for dysuria.       Denies urinary retention "Mass"  Musculoskeletal: Positive for back pain. Negative for gait problem.  Skin: Negative for wound.  Neurological: Negative for headaches.  Psychiatric/Behavioral:  Negative for confusion.  All other systems reviewed and are negative.      Allergies  Review of patient's allergies indicates no known allergies.  Home Medications   Current Outpatient Rx  Name  Route  Sig  Dispense  Refill  . acetaminophen (TYLENOL) 325 MG tablet   Oral   Take 325-650 mg by mouth every 6 (six) hours as needed for mild pain.         Marland Kitchen atenolol (TENORMIN) 100 MG tablet   Oral   Take 100 mg by mouth daily.          Marland Kitchen docusate sodium (COLACE) 100 MG capsule   Oral   Take 100 mg by mouth 2 (two) times daily.         . pravastatin (PRAVACHOL) 40 MG tablet   Oral   Take 40 mg by mouth daily.         . traMADol (ULTRAM) 50 MG tablet   Oral   Take 50 mg by mouth every 6 (six) hours as needed for moderate pain.         Marland Kitchen triamterene-hydrochlorothiazide (MAXZIDE) 75-50 MG per tablet   Oral   Take 0.5 tablets by mouth daily.         Marland Kitchen ibuprofen (ADVIL,MOTRIN) 400 MG tablet   Oral   Take 1 tablet (400 mg total) by mouth every 6 (six) hours as needed.   30 tablet   0   . oxyCODONE-acetaminophen (PERCOCET/ROXICET) 5-325  MG per tablet   Oral   Take 1 tablet by mouth every 6 (six) hours as needed for severe pain.   20 tablet   0    BP 113/88  Pulse 70  Temp(Src) 97.5 F (36.4 C) (Oral)  Resp 16  Ht 5\' 2"  (1.575 m)  SpO2 92% Physical Exam  Nursing note and vitals reviewed. Constitutional: She is oriented to person, place, and time. No distress.  Elderly  HENT:  Head: Normocephalic and atraumatic.  Eyes: Pupils are equal, round, and reactive to light.  Neck: Neck supple.  Cardiovascular: Normal rate, regular rhythm and normal heart sounds.   No murmur heard. Pulmonary/Chest: Effort normal and breath sounds normal. No respiratory distress. She has no wheezes.  Abdominal: Soft. Bowel sounds are normal. There is no tenderness. There is no rebound.  Genitourinary:  Uterine prolapse noted on GU exam approximately 5 inches out of the vaginal  introitus, no rectocele or cystocele noted, prolapse reduced without difficulty  Musculoskeletal:  Tenderness to palpation over the mid lumbar spine, no obvious step off or deformity  Neurological: She is alert and oriented to person, place, and time.  5 out of 5 strength in the bilateral lower extremities, 6-7 beats of clonus noted on the right  Skin: Skin is warm and dry.  Psychiatric: She has a normal mood and affect.    ED Course  Procedures (including critical care time) Labs Review Labs Reviewed  CBC WITH DIFFERENTIAL - Abnormal; Notable for the following:    WBC 14.4 (*)    Neutrophils Relative % 92 (*)    Neutro Abs 13.3 (*)    Lymphocytes Relative 5 (*)    Monocytes Relative 2 (*)    All other components within normal limits  BASIC METABOLIC PANEL - Abnormal; Notable for the following:    Sodium 136 (*)    Chloride 93 (*)    Glucose, Bld 129 (*)    GFR calc non Af Amer 80 (*)    All other components within normal limits   Imaging Review Dg Lumbar Spine Complete  01/05/2014   CLINICAL DATA:  BACK PAIN  EXAM: LUMBAR SPINE - COMPLETE 4+ VIEW  COMPARISON:  MR L SPINE W/O dated 01/05/2014  FINDINGS: A near complete compressive foreign is appreciated involving L2. A second informed is appreciated involving L1 of toxic 25%. The bones are osteopenic. No further fractures or dislocation is identified. Levoscoliosis appreciated within the thoracolumbar spine.  IMPRESSION: Compression deformities involving L1 and L2 as described above appear unchanged when correlated with recent MRI. No acute osseus abnormalities appreciated.   Electronically Signed   By: Salome Holmes M.D.   On: 01/05/2014 16:22   Mr Lumbar Spine Wo Contrast  01/05/2014   CLINICAL DATA:  Low back pain  EXAM: MRI LUMBAR SPINE WITHOUT CONTRAST  TECHNIQUE: Multiplanar, multisequence MR imaging was performed. No intravenous contrast was administered.  COMPARISON:  DG LUMBAR SPINE COMPLETE dated 11/25/2013; CT ABD W/CM dated  09/17/2007; CT CHEST W/CM dated 09/17/2007  FINDINGS: Transverse fracture of the L1 vertebral body with 25% loss of vertebral height and low grade edema in the vertebral body. The visible fracture plane extends to the posterior vertebral margin or there is 3 mm of posterior bony retropulsion the  Progressive L2 compression fracture compared to 11/25/13, with split 100% loss of vertebral body height centrally and considerable edema. There is 9 mm of bony retropulsion along with some separation of anterior and posterior components of the vertebral  body.  Disc desiccation observed at L4-5. There is left hydronephrosis and proximal hydroureter with suspected caliceal diverticula.  Additional findings at individual levels are as follows:  T12-L1: No impingement despite the mild disc bulge and posterior bony retropulsion.  L1-2: Moderate to prominent central stenosis due to the posterior bony retropulsion. Moderate bilateral subarticular lateral recess stenosis.  L2-3:  No impingement.  Mild disc bulge.  L3-4:  No impingement.  Mild disc bulge and facet arthropathy.  L4-5: Mild right and borderline left subarticular lateral recess stenosis due to disc bulge and facet arthropathy.  L5-S1:  No impingement.  IMPRESSION: 1. Nearly complete 100% compression fracture centrally at the L2 vertebral body with anterior and posterior separation of components of the vertebral body. The mid 9 mm of associated posterior bony retropulsion contributes to the moderate to prominent central stenosis and moderate bilateral subarticular lateral recess stenosis at the L1-2 level. The vertebral body has further compressed compared to 11/25/13 2. There is also a subacute transverse fracture of the L1 vertebral body with 25% loss of vertebral height, new compared to 11/25/13. 3. Disc bulge and facet arthropathy cause mild right subarticular lateral recess stenosis at L4-5. 4. Otherwise unexplained left hydronephrosis, proximal hydroureter, and  caliectasis. Correlate with hematuria and consider further workup by hematuria protocol CT scan of the abdomen and pelvis with and without contrast.   Electronically Signed   By: Herbie BaltimoreWalt  Liebkemann M.D.   On: 01/05/2014 14:27     EKG Interpretation None      MDM   Final diagnoses:  Compression fracture of L2  Uterine prolapse   Patient presents with acute worsening of back pain. Known compression fracture.  Patient reports acute worsening of pain. Also noted to have clonus on exam. MRI ordered. On GU exam, patient noted to have uterine prolapse which was reducible. Screening lab work is notable for mild leukocytosis of 14. This is of unknown significance. MRI shows complete compression of L2. Discussed with neurosurgery.  Will place in lumbar corset and administer pain control. Can followup with neurosurgery. Patient also has followup with gynecology. Discuss with Dr. Shawnie PonsPratt who states that nothing needs to be done emergently for uterine prolapse.  Patient will be given Percocet and was instructed to use ibuprofen for the next 2-3 days for anti-inflammatory effect. Given her age, would not use longer than that.  Of note, during dictation of this history. Noted incidental finding on MRI of hydroureter. This was missed at patient disposition yesterday. Will contact patient and have her followup with urinalysis at primary care doctor.  After history, exam, and medical workup I feel the patient has been appropriately medically screened and is safe for discharge home. Pertinent diagnoses were discussed with the patient. Patient was given return precautions.     Shon Batonourtney F Horton, MD 01/06/14 [phone]  Have attempted to contact patient x2.  No limits answering at home. Discussed with social work Tyler Aas(Doris) who will aid in trying  to contact the patient.    Shon Batonourtney F Horton, MD 01/06/14 1131

## 2014-01-05 NOTE — Progress Notes (Signed)
Orthopedic Tech Progress Note Patient Details:  Sabrina ShinglesYoko Martin [birth year] [id number]  Patient ID: Sabrina ShinglesYoko App, female   DOB: [birth year], 80 y.o.   MRN: [id number]   Shawnie PonsCammer, Tekeshia Klahr Carol 01/05/2014, 3:35 PMCalled bio-tech for lumbar corset.

## 2014-01-05 NOTE — ED Notes (Signed)
MD at bedside. 

## 2014-01-05 NOTE — ED Notes (Signed)
Pt husband states the pt has a rectal protruding mass. Not able to visualize. Pt states it has been present for 2 days.

## 2014-01-05 NOTE — ED Notes (Signed)
Pt reports lower back pain x 2 weeks. Denies injury to back, denies urinary symptoms.

## 2014-01-05 NOTE — Discharge Instructions (Signed)
Back, Compression Fracture °A compression fracture happens when a force is put upon the length of your spine. Slipping and falling on your bottom are examples of such a force. When this happens, sometimes the force is great enough to compress the building blocks (vertebral bodies) of your spine. Although this causes a lot of pain, this can usually be treated at home, unless your caregiver feels hospitalization is needed for pain control. °Your backbone (spinal column) is made up of 24 main vertebral bodies in addition to the sacrum and coccyx (see illustration). These are held together by tough fibrous tissues (ligaments) and by support of your muscles. Nerve roots pass through the openings between the vertebrae. A sudden wrenching move, injury, or a fall may cause a compression fracture of one of the vertebral bodies. This may result in back pain or spread of pain into the belly (abdomen), the buttocks, and down the leg into the foot. Pain may also be created by muscle spasm alone. °Large studies have been undertaken to determine the best possible course of action to help your back following injury and also to prevent future problems. The recommendations are as follows. °FOLLOWING A COMPRESSION FRACTURE: °Do the following only if advised by your caregiver.  °· If a back brace has been suggested or provided, wear it as directed. °· DO NOT stop wearing the back brace unless instructed by your caregiver. °· When allowed to return to regular activities, avoid a sedentary life style. Actively exercise. Sporadic weekend binges of tennis, racquetball, water skiing, may actually aggravate or create problems, especially if you are not in condition for that activity. °· Avoid sports requiring sudden body movements until you are in condition for them. Swimming and walking are safer activities. °· Maintain good posture. °· Avoid obesity. °· If not already done, you should have a DEXA scan. Based on the results, be treated for  osteoporosis. °FOLLOWING ACUTE (SUDDEN) INJURY: °· Only take over-the-counter or prescription medicines for pain, discomfort, or fever as directed by your caregiver. °· Use bed rest for only the most extreme acute episode. Prolonged bed rest may aggravate your condition. Ice used for acute conditions is effective. Use a large plastic bag filled with ice. Wrap it in a towel. This also provides excellent pain relief. This may be continuous. Or use it for 30 minutes every 2 hours during acute phase, then as needed. Heat for 30 minutes prior to activities is helpful. °· As soon as the acute phase (the time when your back is too painful for you to do normal activities) is over, it is important to resume normal activities and work hardening programs. Back injuries can cause potentially marked changes in lifestyle. So it is important to attack these problems aggressively. °· See your caregiver for continued problems. He or she can help or refer you for appropriate exercises, physical therapy and work hardening if needed. °· If you are given narcotic medications for your condition, for the next 24 hours DO NOT: °· Drive °· Operate machinery or power tools. °· Sign legal documents. °· DO NOT drink alcohol, take sleeping pills or other medications that may interfere with treatment. °If your caregiver has given you a follow-up appointment, it is very important to keep that appointment. Not keeping the appointment could result in a chronic or permanent injury, pain, and disability. If there is any problem keeping the appointment, you must call back to this facility for assistance.  °SEEK IMMEDIATE MEDICAL CARE IF: °· You develop numbness,   tingling, weakness, or problems with the use of your arms or legs.  You develop severe back pain not relieved with medications.  You have changes in bowel or bladder control.  You have increasing pain in any areas of the body. Document Released: 10/08/2005 Document Revised: 12/31/2011  Document Reviewed: 05/12/2008 [facility] Patient Information 2014 [place].  You were also found to have uterine prolapse. You need to followup with OB/GYN.  If you develop difficulty peeing or having bowel movements, you need to the seen immediately.

## 2014-01-06 NOTE — Progress Notes (Signed)
CSW contacted pt per Dr. Wilkie AyeHorton requesting that pt follow up with PCP for urinalysis. Pt has been scheduled next week with [name]. Dr. Wilkie AyeHorton informed.   [address]Doris Orella Martin, ConnecticutLCSWA [phone]

## 2014-01-12 ENCOUNTER — Encounter (HOSPITAL_COMMUNITY): Payer: Self-pay | Admitting: Pharmacy Technician

## 2014-01-12 ENCOUNTER — Other Ambulatory Visit: Payer: Self-pay | Admitting: Neurological Surgery

## 2014-01-12 ENCOUNTER — Encounter (HOSPITAL_COMMUNITY): Payer: Self-pay | Admitting: *Deleted

## 2014-01-13 ENCOUNTER — Encounter (HOSPITAL_COMMUNITY): Payer: Self-pay | Admitting: *Deleted

## 2014-01-13 ENCOUNTER — Encounter (HOSPITAL_COMMUNITY): Payer: Medicare Other | Admitting: Anesthesiology

## 2014-01-13 ENCOUNTER — Observation Stay (HOSPITAL_COMMUNITY)
Admission: RE | Admit: 2014-01-13 | Discharge: 2014-01-13 | Disposition: A | Discharge status: Home / Self Care | Payer: Medicare Other | Source: Ambulatory Visit | Attending: Neurological Surgery | Admitting: Neurological Surgery

## 2014-01-13 ENCOUNTER — Encounter (HOSPITAL_COMMUNITY)
Admission: RE | Disposition: A | Discharge status: Home / Self Care | Payer: Self-pay | Source: Ambulatory Visit | Attending: Neurological Surgery

## 2014-01-13 ENCOUNTER — Inpatient Hospital Stay (HOSPITAL_COMMUNITY): Payer: Medicare Other

## 2014-01-13 ENCOUNTER — Inpatient Hospital Stay (HOSPITAL_COMMUNITY): Payer: Medicare Other | Admitting: Anesthesiology

## 2014-01-13 DIAGNOSIS — Z8711 Personal history of peptic ulcer disease: Secondary | ICD-10-CM | POA: Insufficient documentation

## 2014-01-13 DIAGNOSIS — I10 Essential (primary) hypertension: Secondary | ICD-10-CM | POA: Insufficient documentation

## 2014-01-13 DIAGNOSIS — W19XXXA Unspecified fall, initial encounter: Secondary | ICD-10-CM | POA: Insufficient documentation

## 2014-01-13 DIAGNOSIS — S32019A Unspecified fracture of first lumbar vertebra, initial encounter for closed fracture: Secondary | ICD-10-CM | POA: Diagnosis present

## 2014-01-13 DIAGNOSIS — S32009A Unspecified fracture of unspecified lumbar vertebra, initial encounter for closed fracture: Principal | ICD-10-CM | POA: Insufficient documentation

## 2014-01-13 DIAGNOSIS — M810 Age-related osteoporosis without current pathological fracture: Secondary | ICD-10-CM | POA: Insufficient documentation

## 2014-01-13 HISTORY — PX: KYPHOPLASTY: SHX5884

## 2014-01-13 HISTORY — DX: Personal history of other medical treatment: Z92.89

## 2014-01-13 HISTORY — DX: Gastric ulcer, unspecified as acute or chronic, without hemorrhage or perforation: K25.9

## 2014-01-13 HISTORY — DX: Urinary tract infection, site not specified: N39.0

## 2014-01-13 LAB — SURGICAL PCR SCREEN
MRSA, PCR: NEGATIVE
Staphylococcus aureus: POSITIVE — AB

## 2014-01-13 SURGERY — KYPHOPLASTY
Anesthesia: General | Site: Back

## 2014-01-13 MED ORDER — PHENOL 1.4 % MT LIQD: 1.0000 | OROMUCOSAL | Status: DC | PRN

## 2014-01-13 MED ORDER — MENTHOL 3 MG MT LOZG: 1.0000 | LOZENGE | OROMUCOSAL | Status: DC | PRN

## 2014-01-13 MED ORDER — PROPOFOL 10 MG/ML IV BOLUS
INTRAVENOUS | Status: DC | PRN
  Administered 2014-01-13: 100 mg via INTRAVENOUS

## 2014-01-13 MED ORDER — ONDANSETRON HCL 4 MG/2ML IJ SOLN
INTRAMUSCULAR | Status: DC | PRN
  Administered 2014-01-13: 4 mg via INTRAVENOUS

## 2014-01-13 MED ORDER — IOHEXOL 300 MG/ML  SOLN
INTRAMUSCULAR | Status: DC | PRN
  Administered 2014-01-13: 50 mL

## 2014-01-13 MED ORDER — NEOSTIGMINE METHYLSULFATE 1 MG/ML IJ SOLN
INTRAMUSCULAR | Status: DC | PRN
  Administered 2014-01-13: 3 mg via INTRAVENOUS
  Administered 2014-01-13: 1 mg via INTRAVENOUS

## 2014-01-13 MED ORDER — 0.9 % SODIUM CHLORIDE (POUR BTL) OPTIME
TOPICAL | Status: DC | PRN
  Administered 2014-01-13: 1000 mL

## 2014-01-13 MED ORDER — BUPIVACAINE HCL (PF) 0.25 % IJ SOLN
INTRAMUSCULAR | Status: DC | PRN
  Administered 2014-01-13: 1 mL

## 2014-01-13 MED ORDER — FENTANYL CITRATE 0.05 MG/ML IJ SOLN
INTRAMUSCULAR | Status: AC
  Filled 2014-01-13: qty 5

## 2014-01-13 MED ORDER — FENTANYL CITRATE 0.05 MG/ML IJ SOLN
INTRAMUSCULAR | Status: DC | PRN
  Administered 2014-01-13 (×3): 50 ug via INTRAVENOUS

## 2014-01-13 MED ORDER — EPHEDRINE SULFATE 50 MG/ML IJ SOLN
INTRAMUSCULAR | Status: DC | PRN
  Administered 2014-01-13: 10 mg via INTRAVENOUS

## 2014-01-13 MED ORDER — SODIUM CHLORIDE 0.9 % IJ SOLN: 3.0000 mL | INTRAMUSCULAR | Status: DC | PRN

## 2014-01-13 MED ORDER — GLYCOPYRROLATE 0.2 MG/ML IJ SOLN
INTRAMUSCULAR | Status: DC | PRN
  Administered 2014-01-13: 0.2 mg via INTRAVENOUS
  Administered 2014-01-13: 0.4 mg via INTRAVENOUS

## 2014-01-13 MED ORDER — ACETAMINOPHEN 325 MG PO TABS: 650.0000 mg | ORAL_TABLET | ORAL | Status: DC | PRN

## 2014-01-13 MED ORDER — MUPIROCIN 2 % EX OINT
TOPICAL_OINTMENT | CUTANEOUS | Status: AC
  Filled 2014-01-13: qty 22

## 2014-01-13 MED ORDER — ONDANSETRON HCL 4 MG/2ML IJ SOLN: 4.0000 mg | INTRAMUSCULAR | Status: DC | PRN

## 2014-01-13 MED ORDER — CEFAZOLIN SODIUM 1-5 GM-% IV SOLN: 1.0000 g | Freq: Three times a day (TID) | INTRAVENOUS | Status: DC

## 2014-01-13 MED ORDER — DROPERIDOL 2.5 MG/ML IJ SOLN
0.6250 mg | INTRAMUSCULAR | Status: DC | PRN
  Filled 2014-01-13: qty 0.25

## 2014-01-13 MED ORDER — SODIUM CHLORIDE 0.9 % IV SOLN: 250.0000 mL | INTRAVENOUS | Status: DC

## 2014-01-13 MED ORDER — ONDANSETRON HCL 4 MG/2ML IJ SOLN
INTRAMUSCULAR | Status: AC
  Filled 2014-01-13: qty 2

## 2014-01-13 MED ORDER — LACTATED RINGERS IV SOLN
INTRAVENOUS | Status: DC | PRN
  Administered 2014-01-13: 17:00:00 via INTRAVENOUS

## 2014-01-13 MED ORDER — GLYCOPYRROLATE 0.2 MG/ML IJ SOLN
INTRAMUSCULAR | Status: AC
  Filled 2014-01-13: qty 2

## 2014-01-13 MED ORDER — FENTANYL CITRATE 0.05 MG/ML IJ SOLN: 25.0000 ug | INTRAMUSCULAR | Status: DC | PRN

## 2014-01-13 MED ORDER — MUPIROCIN 2 % EX OINT
TOPICAL_OINTMENT | Freq: Two times a day (BID) | CUTANEOUS | Status: DC
  Administered 2014-01-13: 1 via NASAL
  Filled 2014-01-13: qty 22

## 2014-01-13 MED ORDER — DEXAMETHASONE SODIUM PHOSPHATE 10 MG/ML IJ SOLN
INTRAMUSCULAR | Status: DC | PRN
  Administered 2014-01-13: 10 mg via INTRAVENOUS

## 2014-01-13 MED ORDER — OXYCODONE-ACETAMINOPHEN 5-325 MG PO TABS: 1.0000 | ORAL_TABLET | Freq: Two times a day (BID) | ORAL | Status: DC | PRN

## 2014-01-13 MED ORDER — NEOSTIGMINE METHYLSULFATE 1 MG/ML IJ SOLN
INTRAMUSCULAR | Status: AC
  Filled 2014-01-13: qty 10

## 2014-01-13 MED ORDER — IBUPROFEN 400 MG PO TABS
400.0000 mg | ORAL_TABLET | Freq: Two times a day (BID) | ORAL | Status: DC | PRN
  Filled 2014-01-13: qty 1

## 2014-01-13 MED ORDER — LACTATED RINGERS IV SOLN
INTRAVENOUS | Status: DC
  Administered 2014-01-13: 14:00:00 via INTRAVENOUS

## 2014-01-13 MED ORDER — HYDROCODONE-ACETAMINOPHEN 5-325 MG PO TABS: 1.0000 | ORAL_TABLET | ORAL | Status: DC | PRN

## 2014-01-13 MED ORDER — LIDOCAINE-EPINEPHRINE 1 %-1:100000 IJ SOLN
INTRAMUSCULAR | Status: DC | PRN
  Administered 2014-01-13: 1 mL via INTRADERMAL

## 2014-01-13 MED ORDER — KETOROLAC TROMETHAMINE 15 MG/ML IJ SOLN: 15.0000 mg | Freq: Four times a day (QID) | INTRAMUSCULAR | Status: DC

## 2014-01-13 MED ORDER — ROCURONIUM BROMIDE 100 MG/10ML IV SOLN
INTRAVENOUS | Status: DC | PRN
  Administered 2014-01-13: 35 mg via INTRAVENOUS

## 2014-01-13 MED ORDER — SODIUM CHLORIDE 0.9 % IJ SOLN: 3.0000 mL | Freq: Two times a day (BID) | INTRAMUSCULAR | Status: DC

## 2014-01-13 MED ORDER — LIDOCAINE HCL (CARDIAC) 20 MG/ML IV SOLN
INTRAVENOUS | Status: DC | PRN
  Administered 2014-01-13: 30 mg via INTRAVENOUS

## 2014-01-13 MED ORDER — ACETAMINOPHEN 650 MG RE SUPP: 650.0000 mg | RECTAL | Status: DC | PRN

## 2014-01-13 SURGICAL SUPPLY — 48 items
BANDAGE ADHESIVE 1X3 (GAUZE/BANDAGES/DRESSINGS) IMPLANT
BLADE SURG 11 STRL SS (BLADE) ×3 IMPLANT
BLADE SURG ROTATE 9660 (MISCELLANEOUS) IMPLANT
CEMENT BONE KYPHX HV R (Orthopedic Implant) ×3 IMPLANT
CEMENT KYPHON C01A KIT/MIXER (Cement) ×3 IMPLANT
CONT SPEC 4OZ CLIKSEAL STRL BL (MISCELLANEOUS) ×6 IMPLANT
DECANTER SPIKE VIAL GLASS SM (MISCELLANEOUS) ×3 IMPLANT
DERMABOND ADHESIVE PROPEN (GAUZE/BANDAGES/DRESSINGS) ×2
DERMABOND ADVANCED (GAUZE/BANDAGES/DRESSINGS)
DERMABOND ADVANCED .7 DNX12 (GAUZE/BANDAGES/DRESSINGS) IMPLANT
DERMABOND ADVANCED .7 DNX6 (GAUZE/BANDAGES/DRESSINGS) ×1 IMPLANT
DRAPE C-ARM 42X72 X-RAY (DRAPES) ×3 IMPLANT
DRAPE INCISE IOBAN 66X45 STRL (DRAPES) ×3 IMPLANT
DRAPE LAPAROTOMY 100X72X124 (DRAPES) ×3 IMPLANT
DRAPE PROXIMA HALF (DRAPES) ×3 IMPLANT
DURAPREP 26ML APPLICATOR (WOUND CARE) ×3 IMPLANT
GAUZE SPONGE 4X4 16PLY XRAY LF (GAUZE/BANDAGES/DRESSINGS) ×3 IMPLANT
GLOVE BIO SURGEON STRL SZ7.5 (GLOVE) IMPLANT
GLOVE BIOGEL PI IND STRL 7.5 (GLOVE) IMPLANT
GLOVE BIOGEL PI IND STRL 8 (GLOVE) ×2 IMPLANT
GLOVE BIOGEL PI IND STRL 8.5 (GLOVE) ×1 IMPLANT
GLOVE BIOGEL PI INDICATOR 7.5 (GLOVE)
GLOVE BIOGEL PI INDICATOR 8 (GLOVE) ×4
GLOVE BIOGEL PI INDICATOR 8.5 (GLOVE) ×2
GLOVE ECLIPSE 7.5 STRL STRAW (GLOVE) ×9 IMPLANT
GLOVE ECLIPSE 8.5 STRL (GLOVE) ×3 IMPLANT
GLOVE EXAM NITRILE LRG STRL (GLOVE) IMPLANT
GLOVE EXAM NITRILE MD LF STRL (GLOVE) IMPLANT
GLOVE EXAM NITRILE XL STR (GLOVE) IMPLANT
GLOVE EXAM NITRILE XS STR PU (GLOVE) IMPLANT
GOWN BRE IMP SLV AUR LG STRL (GOWN DISPOSABLE) IMPLANT
GOWN BRE IMP SLV AUR XL STRL (GOWN DISPOSABLE) IMPLANT
GOWN STRL REIN 2XL LVL4 (GOWN DISPOSABLE) IMPLANT
GOWN STRL REUS W/TWL 2XL LVL3 (GOWN DISPOSABLE) ×9 IMPLANT
KIT BASIN OR (CUSTOM PROCEDURE TRAY) ×3 IMPLANT
KIT ROOM TURNOVER OR (KITS) ×3 IMPLANT
NEEDLE HYPO 25X1 1.5 SAFETY (NEEDLE) ×3 IMPLANT
NS IRRIG 1000ML POUR BTL (IV SOLUTION) ×3 IMPLANT
PACK SURGICAL SETUP 50X90 (CUSTOM PROCEDURE TRAY) ×3 IMPLANT
PAD ARMBOARD 7.5X6 YLW CONV (MISCELLANEOUS) ×9 IMPLANT
SPECIMEN JAR SMALL (MISCELLANEOUS) IMPLANT
SUT VIC AB 3-0 SH 8-18 (SUTURE) ×3 IMPLANT
SUT VIC AB 4-0 P-3 18X BRD (SUTURE) ×1 IMPLANT
SUT VIC AB 4-0 P3 18 (SUTURE) ×2
SYR CONTROL 10ML LL (SYRINGE) ×6 IMPLANT
TOWEL OR 17X24 6PK STRL BLUE (TOWEL DISPOSABLE) ×3 IMPLANT
TOWEL OR 17X26 10 PK STRL BLUE (TOWEL DISPOSABLE) ×3 IMPLANT
TRAY KYPHOPAK 20/3 ONESTEP 1ST (MISCELLANEOUS) ×3 IMPLANT

## 2014-01-13 NOTE — Transfer of Care (Signed)
Immediate Anesthesia Transfer of Care Note  Patient: Jonn ShinglesYoko Clendenin  Procedure(s) Performed: Procedure(s) with comments: Lumbar one KYPHOPLASTY (N/A) - L1 KYPHOPLASTY  Patient Location: PACU  Anesthesia Type:General  Level of Consciousness: awake, alert , oriented and patient cooperative  Airway & Oxygen Therapy: Patient Spontanous Breathing and Patient connected to nasal cannula oxygen  Post-op Assessment: Report given to PACU RN, Post -op Vital signs reviewed and stable, Patient moving all extremities X 4 and Patient able to stick tongue midline  Post vital signs: Reviewed and stable  Complications: No apparent anesthesia complications

## 2014-01-13 NOTE — Anesthesia Postprocedure Evaluation (Signed)
  Anesthesia Post-op Note  Patient: [name]  Procedure(s) Performed: Procedure(s) with comments: Lumbar one KYPHOPLASTY (N/A) - L1 KYPHOPLASTY  Patient Location: PACU  Anesthesia Type:General  Level of Consciousness: awake, alert , oriented and patient cooperative  Airway and Oxygen Therapy: Patient Spontanous Breathing  Post-op Pain: mild  Post-op Assessment: Post-op Vital signs reviewed, Patient's Cardiovascular Status Stable, Respiratory Function Stable, Patent Airway, No signs of Nausea or vomiting and Pain level controlled  Post-op Vital Signs: stable  Complications: No apparent anesthesia complications

## 2014-01-13 NOTE — Discharge Summary (Signed)
Physician Discharge Summary  Patient ID: Jonn ShinglesYoko Fuquay MRN: [id number] DOB/AGE: 499-Jan-1931 80 y.o.  Admit date: 01/13/2014 Discharge date: 01/13/2014  Admission Diagnoses: L1 pathologic compression fracture, L2 vertebra plana  Discharge Diagnoses: L1 pathologic compression fracture, L2 vertebra plana  Active Problems:   * No active hospital problems. *   Discharged Condition: fair  Hospital Course: Patient was admitted to undergo acrylic balloon kyphoplasty which she tolerated well. She is discharged home  Consults: None  Significant Diagnostic Studies: None  Treatments: surgery: L1 acrylic balloon kyphoplasty  Discharge Exam: Blood pressure 151/66, pulse 66, temperature 97.3 F (36.3 C), temperature source Oral, SpO2 98.00%. Motor function is intact lower externally function normal  Disposition: 01-Home or Self Care   Future Appointments Provider Department Dept Phone   02/18/2014 1:45 PM Willodean Rosenthalarolyn Harraway-Smith, MD [facility] [phone]       Medication List    ASK your doctor about these medications       atenolol 100 MG tablet  Commonly known as:  TENORMIN  Take 100 mg by mouth daily.     CALCIUM 600+D3 600-800 MG-UNIT Tabs  Generic drug:  Calcium Carb-Cholecalciferol  Take 2 tablets by mouth every evening.     docusate sodium 100 MG capsule  Commonly known as:  COLACE  Take 300 mg by mouth at bedtime.     ibuprofen 400 MG tablet  Commonly known as:  ADVIL,MOTRIN  Take 400 mg by mouth 2 (two) times daily as needed (pain).     oxyCODONE-acetaminophen 5-325 MG per tablet  Commonly known as:  PERCOCET/ROXICET  Take 1 tablet by mouth 2 (two) times daily as needed for severe pain (pain).     pravastatin 40 MG tablet  Commonly known as:  PRAVACHOL  Take 40 mg by mouth daily.     triamterene-hydrochlorothiazide 75-50 MG per tablet  Commonly known as:  MAXZIDE  Take 0.5 tablets by mouth daily.     Vitamin D (Cholecalciferol) 1000 UNITS Tabs   Take 1,000 Units by mouth every evening.         SignedStefani Dama: Lizett Chowning J 01/13/2014, 5:39 PM

## 2014-01-13 NOTE — Progress Notes (Signed)
Discharge instructions given to patient and husband, both verbalized understanding. Patient will call Dr Verlee RossettiElsner's office 01/14/14 for f/u appointment.

## 2014-01-13 NOTE — H&P (Signed)
CHIEF COMPLAINT:                                          Back pain.  HISTORY OF PRESENT ILLNESS:                     [name] is an 80 year old right handed individual who tells me that somewhere around the end of January, specifically on the 29th, she started rather insidiously developing substantial back pain.  On 11/25/2013, she [facility], did some x-rays, which demonstrated a compression fracture of L2.  She had some difficulties with pain control and became constipated severely and impacted.  Ultimately, it was found after a period of some rest that she had worsening back pain and last week new x-rays and an MRI of the lumbar spine was performed.  This demonstrated that the patient now had a vertebral plane at L2, but a new compression fracture at L1.  She is seen now for further treatment of this process.  REVIEW OF SYSTEMS:                                    Systems review is notable for wearing glasses, having had cataracts, high blood pressure, urinary tract infection, change in bowel habits, ulcers, gastritis and some allergies.  Her Oswestry Disability Index is rated at 50 today.  PAST MEDICAL HISTORY:                                    Current Medical Conditions:  Reveals that she has some hypertension.  She had a history of an ulcer in 2014.  SOCIAL HISTORY:                                            Her personal history reveals that she does not smoke or use alcohol.  She has had some weight loss during this period of time.  PHYSICAL EXAMINATION:                                Currently her height and weight are 5 feet 1 inch and 108 pounds.  Physical examination reveals that she stands straight and erect.  There is no tenderness to palpation or percussion in the lumbar spine.  She does have a slight Gibbus deformity at the thoracolumbar junction.  On examination, I  note that her motor strength in the major groups including iliopsoas, quadriceps,  tibialis anterior and gastrocs are all good.  Deep tendon reflexes are 2+ on the right side in the patellae and the Achilles.  Trace in the patellae on the left side and trace in the Achilles on the left side.  Vibratory sensation appears intact distally in the lower extremities.  IMPRESSION:  The patient has evidence of an L1 and L2 compression fracture.  The L2 compression fracture is now vertebra plana and there is some modest spondylytic stenosis at that level.  At L1, she has evidence of about 50% compression fracture of the vertebrae.  This vertebrae would be amenable to acrylic balloon kyphoplasty.  More concerning problem is that she needs some treatment for her underlying osteoporosis.  She is to see Dr. Windle Guard tomorrow and her question whether the patient will be a candidate for Forteo or some other medical treatment for her rather significant osteoporosis, I will defer this to Dr. Jeannetta Nap.  We will plan on scheduling acrylic balloon kyphoplasty as an outpatient at the earliest convenience.

## 2014-01-13 NOTE — Op Note (Signed)
Date of surgery: 01/13/2014 Preoperative diagnosis: L1 pathologic compression fracture Postoperative diagnosis: L1 pathologic compression fracture, L2 vertebra plana  Procedure: L1 acrylic balloon kyphoplasty Surgeon: Sherilyn CooterHenry Tana Trefry,MD Indications: Patient is an 80 year old individual who sustained a fall several months ago she had acute pain that gradually got better she is found to have an L2 compression fracture. More recently she had another fall that exacerbated further pain in her back and she now has evidence of an subacute L1 compression fracture on top of an L2 vertebra plana. She's been advised regarding kyphoplasty. She has pain that is unresponsive to conservative efforts.  Procedure: The patient was brought to the operating room supine on a stretcher. After the smooth induction of general endotracheal anesthesia the patient was carefully positioned in the prone position on the operating table with the bony prominences appropriately padded and protected. Overalls were used under the patient's chest back was prepped with alcohol and DuraPrep and draped in a sterile fashion and then biplane fluoroscopy was used to isolate the L1 vertebrae. A pedicle entry site for this her vertebrae was chosen 4 cm lateral to the pedicle at the 9:00 position. A Jamshidi needle was inserted into the pedicle via the transfer radicular transvertebral approach. Biplane fluoroscopy was used during this procedure. Then the cannula was in the vertebral body the inner cannula was removed and a bone drill was used to create a space for the balloon. Balloon was then inflated 150 mmHg and was noted to deteriorate to 80 millimeters of pressure. 4 Cc of dye was injected into the balloon. The med was mixed to appropriate consistency and then injected into the vertebral body under biplane fluoroscopy until complete filling was achieved. A total of 6 cc of cement was injected.  Final fluoroscopic images were obtained the Jamshidi  needle was removed the incision was closed with a singular 3-0 Vicryl stitch and the patient was then returned to the recovery room in stable condition.

## 2014-01-13 NOTE — Anesthesia Preprocedure Evaluation (Addendum)
Anesthesia Evaluation  Patient identified by MRN, date of birth, ID band Patient awake    Reviewed: Allergy & Precautions, H&P , NPO status , Patient's Chart, lab work & pertinent test results, reviewed documented beta blocker date and time   History of Anesthesia Complications (+) history of anesthetic complications  Airway Mallampati: II TM Distance: >3 FB Neck ROM: Full    Dental  (+) Teeth Intact, Caps, Dental Advisory Given   Pulmonary Current Smoker, former smoker,  breath sounds clear to auscultation  Pulmonary exam normal       Cardiovascular hypertension, Pt. on medications Rhythm:Regular Rate:Normal     Neuro/Psych Back pain    GI/Hepatic negative GI ROS, Neg liver ROS, PUD,   Endo/Other  negative endocrine ROS  Renal/GU negative Renal ROS     Musculoskeletal   Abdominal   Peds  Hematology negative hematology ROS (+)   Anesthesia Other Findings   Reproductive/Obstetrics                        Anesthesia Physical Anesthesia Plan  ASA: II  Anesthesia Plan: General   Post-op Pain Management:    Induction: Intravenous  Airway Management Planned: Oral ETT  Additional Equipment:   Intra-op Plan:   Post-operative Plan: Extubation in OR  Informed Consent: I have reviewed the patients History and Physical, chart, labs and discussed the procedure including the risks, benefits and alternatives for the proposed anesthesia with the patient or authorized representative who has indicated his/her understanding and acceptance.   Dental advisory given  Plan Discussed with: CRNA and Surgeon  Anesthesia Plan Comments: (Plan routine monitors, GETA)        Anesthesia Quick Evaluation

## 2014-01-14 ENCOUNTER — Encounter (HOSPITAL_COMMUNITY): Payer: Self-pay | Admitting: Neurological Surgery

## 2014-01-15 ENCOUNTER — Other Ambulatory Visit: Payer: Self-pay | Admitting: Family Medicine

## 2014-01-15 ENCOUNTER — Observation Stay (HOSPITAL_COMMUNITY)
Admission: EM | Admit: 2014-01-15 | Discharge: 2014-01-16 | Disposition: A | Discharge status: Home / Self Care | Payer: Medicare Other | Attending: Internal Medicine | Admitting: Internal Medicine

## 2014-01-15 DIAGNOSIS — I517 Cardiomegaly: Secondary | ICD-10-CM | POA: Insufficient documentation

## 2014-01-15 DIAGNOSIS — R112 Nausea with vomiting, unspecified: Secondary | ICD-10-CM | POA: Diagnosis present

## 2014-01-15 DIAGNOSIS — N179 Acute kidney failure, unspecified: Secondary | ICD-10-CM | POA: Diagnosis present

## 2014-01-15 DIAGNOSIS — F17200 Nicotine dependence, unspecified, uncomplicated: Secondary | ICD-10-CM | POA: Insufficient documentation

## 2014-01-15 DIAGNOSIS — K5289 Other specified noninfective gastroenteritis and colitis: Secondary | ICD-10-CM | POA: Insufficient documentation

## 2014-01-15 DIAGNOSIS — S32019A Unspecified fracture of first lumbar vertebra, initial encounter for closed fracture: Secondary | ICD-10-CM

## 2014-01-15 DIAGNOSIS — I10 Essential (primary) hypertension: Secondary | ICD-10-CM | POA: Diagnosis present

## 2014-01-15 DIAGNOSIS — E785 Hyperlipidemia, unspecified: Secondary | ICD-10-CM | POA: Insufficient documentation

## 2014-01-15 DIAGNOSIS — E43 Unspecified severe protein-calorie malnutrition: Secondary | ICD-10-CM

## 2014-01-15 DIAGNOSIS — E876 Hypokalemia: Secondary | ICD-10-CM | POA: Diagnosis present

## 2014-01-15 DIAGNOSIS — E782 Mixed hyperlipidemia: Secondary | ICD-10-CM | POA: Diagnosis present

## 2014-01-15 DIAGNOSIS — E860 Dehydration: Secondary | ICD-10-CM | POA: Diagnosis present

## 2014-01-15 DIAGNOSIS — I951 Orthostatic hypotension: Principal | ICD-10-CM

## 2014-01-15 DIAGNOSIS — R7989 Other specified abnormal findings of blood chemistry: Secondary | ICD-10-CM

## 2014-01-15 DIAGNOSIS — Z9889 Other specified postprocedural states: Secondary | ICD-10-CM | POA: Insufficient documentation

## 2014-01-15 DIAGNOSIS — N1330 Unspecified hydronephrosis: Secondary | ICD-10-CM

## 2014-01-15 DIAGNOSIS — N289 Disorder of kidney and ureter, unspecified: Secondary | ICD-10-CM | POA: Insufficient documentation

## 2014-01-15 LAB — I-STAT CG4 LACTIC ACID, ED: Lactic Acid, Venous: 2.98 mmol/L — ABNORMAL HIGH (ref 0.5–2.2)

## 2014-01-15 LAB — CBC
HEMATOCRIT: 33.6 % — AB (ref 36.0–46.0)
Hemoglobin: 11.6 g/dL — ABNORMAL LOW (ref 12.0–15.0)
MCH: 30.4 pg (ref 26.0–34.0)
MCHC: 34.5 g/dL (ref 30.0–36.0)
MCV: 88.2 fL (ref 78.0–100.0)
Platelets: 298 10*3/uL (ref 150–400)
RBC: 3.81 MIL/uL — ABNORMAL LOW (ref 3.87–5.11)
RDW: 12.9 % (ref 11.5–15.5)
WBC: 15.6 10*3/uL — ABNORMAL HIGH (ref 4.0–10.5)

## 2014-01-15 LAB — I-STAT CHEM 8, ED
BUN: 29 mg/dL — ABNORMAL HIGH (ref 6–23)
Calcium, Ion: 1.13 mmol/L (ref 1.13–1.30)
Chloride: 87 mEq/L — ABNORMAL LOW (ref 96–112)
Creatinine, Ser: 1.2 mg/dL — ABNORMAL HIGH (ref 0.50–1.10)
GLUCOSE: 177 mg/dL — AB (ref 70–99)
HCT: 38 % (ref 36.0–46.0)
HEMOGLOBIN: 12.9 g/dL (ref 12.0–15.0)
Potassium: 2.6 mEq/L — CL (ref 3.7–5.3)
SODIUM: 138 meq/L (ref 137–147)
TCO2: 37 mmol/L (ref 0–100)

## 2014-01-15 LAB — POC OCCULT BLOOD, ED: Fecal Occult Bld: NEGATIVE

## 2014-01-15 MED ORDER — ONDANSETRON HCL 4 MG/2ML IJ SOLN
4.0000 mg | Freq: Once | INTRAMUSCULAR | Status: AC
  Administered 2014-01-16: 4 mg via INTRAVENOUS
  Filled 2014-01-15: qty 2

## 2014-01-15 MED ORDER — SODIUM CHLORIDE 0.9 % IV SOLN
1000.0000 mL | Freq: Once | INTRAVENOUS | Status: AC
  Administered 2014-01-16: 1000 mL via INTRAVENOUS

## 2014-01-15 MED ORDER — FAMOTIDINE IN NACL 20-0.9 MG/50ML-% IV SOLN
20.0000 mg | Freq: Once | INTRAVENOUS | Status: AC
  Administered 2014-01-16: 20 mg via INTRAVENOUS
  Filled 2014-01-15: qty 50

## 2014-01-15 MED ORDER — SODIUM CHLORIDE 0.9 % IV SOLN
1000.0000 mL | INTRAVENOUS | Status: DC
  Administered 2014-01-16 (×2): 1000 mL via INTRAVENOUS

## 2014-01-15 NOTE — ED Notes (Signed)
This past wed. In short stay for back surgery.  Now, 3/26, started throwing up - food.  Took some nausea med. Tonight.  According to husband, throwing up coffee ground emesis.  Hx. Of large stomach ulcer.

## 2014-01-15 NOTE — ED Provider Notes (Signed)
CSN: [id number]     Arrival date & time 01/15/14  2303 History   First MD Initiated Contact with Patient 01/15/14 2332     Chief Complaint  Patient presents with  . Hematemesis     (Consider location/radiation/quality/duration/timing/severity/associated sxs/prior Treatment) The history is provided by the patient and the spouse.   80 year old female is brought in for possible hematemesis. She was in the hospital 2 days ago for kyphoplasty. Last night, she started developing nausea and vomited some food. She continued to vomit through the day today and has been for the emesis looked dark and then he noticed what he thought was coffee-ground material. She had been given a prescription for promethazine, blood was vomiting after taking the tablets. He called his PCP, who advised them to come to the ED. She is not complaining of any abdominal pain. Of note, she does have a history of peptic ulcer disease and had been taking ibuprofen prior to her kyphoplasty.  Past Medical History  Diagnosis Date  . Hypertension   . High cholesterol   . Cardiomegaly     Hattie Perch/notes 02/17/2013  . UTI (lower urinary tract infection)   . Gastric ulcer   . History of blood transfusion    Past Surgical History  Procedure Laterality Date  . No past surgeries    . Esophagogastroduodenoscopy endoscopy    . Kyphoplasty N/A 01/13/2014    Procedure: Lumbar one KYPHOPLASTY;  Surgeon: Barnett AbuHenry Elsner, MD;  Location: [facility];  Service: Neurosurgery;  Laterality: N/A;  L1 KYPHOPLASTY   No family history on file. History  Substance Use Topics  . Smoking status: Current Every Day Smoker -- 0.12 packs/day for 60 years    Types: Cigarettes  . Smokeless tobacco: Never Used  . Alcohol Use: No   OB History   Grav Para Term Preterm Abortions TAB SAB Ect Mult Living                 Review of Systems  All other systems reviewed and are negative.      Allergies  Review of patient's allergies indicates no known  allergies.  Home Medications   Current Outpatient Rx  Name  Route  Sig  Dispense  Refill  . atenolol (TENORMIN) 100 MG tablet   Oral   Take 100 mg by mouth daily.          . Calcium Carb-Cholecalciferol (CALCIUM 600+D3) 600-800 MG-UNIT TABS   Oral   Take 2 tablets by mouth every evening.         . docusate sodium (COLACE) 100 MG capsule   Oral   Take 300 mg by mouth at bedtime.          Marland Kitchen. ibuprofen (ADVIL,MOTRIN) 400 MG tablet   Oral   Take 400 mg by mouth 2 (two) times daily as needed (pain).         Marland Kitchen. oxyCODONE-acetaminophen (PERCOCET/ROXICET) 5-325 MG per tablet   Oral   Take 1 tablet by mouth 2 (two) times daily as needed for severe pain (pain).         . pravastatin (PRAVACHOL) 40 MG tablet   Oral   Take 40 mg by mouth daily.         Marland Kitchen. triamterene-hydrochlorothiazide (MAXZIDE) 75-50 MG per tablet   Oral   Take 0.5 tablets by mouth daily.         . Vitamin D, Cholecalciferol, 1000 UNITS TABS   Oral   Take 1,000 Units by mouth  every evening.          BP 142/53  Pulse 80  Temp(Src) 98.2 F (36.8 C) (Oral)  Resp 18  SpO2 96% Physical Exam  Nursing note and vitals reviewed.  80 year old female, resting comfortably and in no acute distress. Vital signs are significant for mild hypertension with blood pressure 142/53. Oxygen saturation is 96%, which is normal. Head is normocephalic and atraumatic. PERRLA, EOMI. Oropharynx is clear. Neck is nontender and supple without adenopathy or JVD. Back is nontender and there is no CVA tenderness. She has a brace across her lower chest and thoracic area. Lungs are clear without rales, wheezes, or rhonchi. Chest is nontender. Heart has regular rate and rhythm without murmur. Abdomen is soft, flat, nontender without masses or hepatosplenomegaly and peristalsis is normoactive. Rectal: Eczema hemorrhoids present, decreased sphincter tone, and small amount of stool present in the rectal vault which is light  brown. Extremities have no cyanosis or edema, full range of motion is present. Skin is warm and dry without rash. Neurologic: Mental status is normal, cranial nerves are intact, there are no motor or sensory deficits.  ED Course  Procedures (including critical care time) Labs Review Results for orders placed during the hospital encounter of 01/15/14  CBC      Result Value Ref Range   WBC 15.6 (*) 4.0 - 10.5 K/uL   RBC 3.81 (*) 3.87 - 5.11 MIL/uL   Hemoglobin 11.6 (*) 12.0 - 15.0 g/dL   HCT 16.1 (*) 09.6 - 04.5 %   MCV 88.2  78.0 - 100.0 fL   MCH 30.4  26.0 - 34.0 pg   MCHC 34.5  30.0 - 36.0 g/dL   RDW 40.9  81.1 - 91.4 %   Platelets 298  150 - 400 K/uL  LIPASE, BLOOD      Result Value Ref Range   Lipase 13  11 - 59 U/L  POTASSIUM      Result Value Ref Range   Potassium 2.8 (*) 3.7 - 5.3 mEq/L  DIFFERENTIAL      Result Value Ref Range   Neutrophils Relative % 86 (*) 43 - 77 %   Neutro Abs 12.4 (*) 1.7 - 7.7 K/uL   Lymphocytes Relative 8 (*) 12 - 46 %   Lymphs Abs 1.2  0.7 - 4.0 K/uL   Monocytes Relative 6  3 - 12 %   Monocytes Absolute 0.8  0.1 - 1.0 K/uL   Eosinophils Relative 0  0 - 5 %   Eosinophils Absolute 0.0  0.0 - 0.7 K/uL   Basophils Relative 0  0 - 1 %   Basophils Absolute 0.0  0.0 - 0.1 K/uL  I-STAT CHEM 8, ED      Result Value Ref Range   Sodium 138  137 - 147 mEq/L   Potassium 2.6 (*) 3.7 - 5.3 mEq/L   Chloride 87 (*) 96 - 112 mEq/L   BUN 29 (*) 6 - 23 mg/dL   Creatinine, Ser 7.82 (*) 0.50 - 1.10 mg/dL   Glucose, Bld 956 (*) 70 - 99 mg/dL   Calcium, Ion 2.13  0.86 - 1.30 mmol/L   TCO2 37  0 - 100 mmol/L   Hemoglobin 12.9  12.0 - 15.0 g/dL   HCT 57.8  46.9 - 62.9 %   Comment NOTIFIED PHYSICIAN    I-STAT CG4 LACTIC ACID, ED      Result Value Ref Range   Lactic Acid, Venous 2.98 (*) 0.5 - 2.2  mmol/L  POC OCCULT BLOOD, ED      Result Value Ref Range   Fecal Occult Bld NEGATIVE  NEGATIVE   MDM   Final diagnoses:  Dehydration  Hypokalemia  Elevated  lactic acid level  Renal insufficiency  Orthostatic hypotension    Possible hematemesis. Stool Hemoccult is come back negative as sodas doubtful that she has a major bleed. Initial electrolytes showed potassium 2.6 and this will need to be rechecked. Lactic acid level is slightly elevated and she is started on IV hydration.  Repeat potassium confirms hypokalemia so she is given intravenous potassium. Hemoglobin has dropped from value from 12 days ago, but all other hemoglobins are substantially lower and I do not know if that was an accurate reading from 12 days ago. Creatinine is noted to have risen not acutely consistent with dehydration. Lactic acid level is slightly elevated which is also consistent with dehydration. She is noted to have significant drop in systolic blood pressure with orthostatic testing. Given all of these findings, I feel she needs to be admitted to the hospital for ongoing hydration. She also need to have serial hemoglobins to make sure that she is not actually having some bleeding. Case is discussed with Dr. Lovell Sheehan of [facility] who agrees to admit the patient.  Dione Booze, MD 01/16/14 [id number]

## 2014-01-16 ENCOUNTER — Observation Stay (HOSPITAL_COMMUNITY): Payer: Medicare Other

## 2014-01-16 ENCOUNTER — Encounter (HOSPITAL_COMMUNITY): Payer: Self-pay | Admitting: Emergency Medicine

## 2014-01-16 DIAGNOSIS — I951 Orthostatic hypotension: Principal | ICD-10-CM

## 2014-01-16 DIAGNOSIS — E872 Acidosis, unspecified: Secondary | ICD-10-CM

## 2014-01-16 DIAGNOSIS — E785 Hyperlipidemia, unspecified: Secondary | ICD-10-CM

## 2014-01-16 DIAGNOSIS — E860 Dehydration: Secondary | ICD-10-CM | POA: Diagnosis present

## 2014-01-16 DIAGNOSIS — R112 Nausea with vomiting, unspecified: Secondary | ICD-10-CM | POA: Diagnosis present

## 2014-01-16 DIAGNOSIS — S32009A Unspecified fracture of unspecified lumbar vertebra, initial encounter for closed fracture: Secondary | ICD-10-CM

## 2014-01-16 DIAGNOSIS — I10 Essential (primary) hypertension: Secondary | ICD-10-CM

## 2014-01-16 DIAGNOSIS — N179 Acute kidney failure, unspecified: Secondary | ICD-10-CM | POA: Diagnosis present

## 2014-01-16 DIAGNOSIS — R7989 Other specified abnormal findings of blood chemistry: Secondary | ICD-10-CM

## 2014-01-16 DIAGNOSIS — E876 Hypokalemia: Secondary | ICD-10-CM

## 2014-01-16 LAB — LIPASE, BLOOD: LIPASE: 13 U/L (ref 11–59)

## 2014-01-16 LAB — BASIC METABOLIC PANEL
BUN: 22 mg/dL (ref 6–23)
CO2: 31 mEq/L (ref 19–32)
Calcium: 8.9 mg/dL (ref 8.4–10.5)
Chloride: 96 mEq/L (ref 96–112)
Creatinine, Ser: 0.73 mg/dL (ref 0.50–1.10)
GFR, EST AFRICAN AMERICAN: 90 mL/min — AB (ref >90)
GFR, EST NON AFRICAN AMERICAN: 77 mL/min — AB (ref >90)
Glucose, Bld: 119 mg/dL — ABNORMAL HIGH (ref 70–99)
POTASSIUM: 3 meq/L — AB (ref 3.7–5.3)
SODIUM: 142 meq/L (ref 137–147)

## 2014-01-16 LAB — DIFFERENTIAL
Basophils Absolute: 0 10*3/uL (ref 0.0–0.1)
Basophils Relative: 0 % (ref 0–1)
Eosinophils Absolute: 0 10*3/uL (ref 0.0–0.7)
Eosinophils Relative: 0 % (ref 0–5)
LYMPHS ABS: 1.2 10*3/uL (ref 0.7–4.0)
Lymphocytes Relative: 8 % — ABNORMAL LOW (ref 12–46)
MONOS PCT: 6 % (ref 3–12)
Monocytes Absolute: 0.8 10*3/uL (ref 0.1–1.0)
NEUTROS ABS: 12.4 10*3/uL — AB (ref 1.7–7.7)
Neutrophils Relative %: 86 % — ABNORMAL HIGH (ref 43–77)

## 2014-01-16 LAB — MAGNESIUM: MAGNESIUM: 1.5 mg/dL (ref 1.5–2.5)

## 2014-01-16 LAB — CBC
HCT: 29.8 % — ABNORMAL LOW (ref 36.0–46.0)
HEMOGLOBIN: 10 g/dL — AB (ref 12.0–15.0)
MCH: 29.9 pg (ref 26.0–34.0)
MCHC: 33.6 g/dL (ref 30.0–36.0)
MCV: 89 fL (ref 78.0–100.0)
Platelets: 249 10*3/uL (ref 150–400)
RBC: 3.35 MIL/uL — AB (ref 3.87–5.11)
RDW: 13 % (ref 11.5–15.5)
WBC: 12.7 10*3/uL — ABNORMAL HIGH (ref 4.0–10.5)

## 2014-01-16 LAB — POTASSIUM
Potassium: 2.8 mEq/L — CL (ref 3.7–5.3)
Potassium: 3.4 mEq/L — ABNORMAL LOW (ref 3.7–5.3)

## 2014-01-16 MED ORDER — HYDRALAZINE HCL 20 MG/ML IJ SOLN
5.0000 mg | Freq: Four times a day (QID) | INTRAMUSCULAR | Status: DC | PRN
  Administered 2014-01-16: 5 mg via INTRAVENOUS
  Filled 2014-01-16: qty 1

## 2014-01-16 MED ORDER — POTASSIUM CHLORIDE CRYS ER 20 MEQ PO TBCR
40.0000 meq | EXTENDED_RELEASE_TABLET | Freq: Once | ORAL | Status: AC
  Administered 2014-01-16: 40 meq via ORAL
  Filled 2014-01-16: qty 2

## 2014-01-16 MED ORDER — ACETAMINOPHEN 650 MG RE SUPP: 650.0000 mg | Freq: Four times a day (QID) | RECTAL | Status: DC | PRN

## 2014-01-16 MED ORDER — ONDANSETRON HCL 4 MG PO TABS: 4.0000 mg | ORAL_TABLET | Freq: Four times a day (QID) | ORAL | Status: DC | PRN

## 2014-01-16 MED ORDER — ONDANSETRON HCL 4 MG PO TABS: 4.0000 mg | ORAL_TABLET | Freq: Three times a day (TID) | ORAL | Status: DC | PRN

## 2014-01-16 MED ORDER — ONDANSETRON HCL 4 MG/2ML IJ SOLN: 4.0000 mg | Freq: Four times a day (QID) | INTRAMUSCULAR | Status: DC | PRN

## 2014-01-16 MED ORDER — HYDROMORPHONE HCL PF 1 MG/ML IJ SOLN
0.5000 mg | INTRAMUSCULAR | Status: DC | PRN
Start: 2014-01-16 — End: 2014-01-16

## 2014-01-16 MED ORDER — TRIAMTERENE-HCTZ 75-50 MG PO TABS: 0.5000 | ORAL_TABLET | Freq: Every day | ORAL | Status: DC

## 2014-01-16 MED ORDER — ONDANSETRON HCL 4 MG/2ML IJ SOLN: 4.0000 mg | Freq: Three times a day (TID) | INTRAMUSCULAR | Status: DC | PRN

## 2014-01-16 MED ORDER — OXYCODONE HCL 5 MG PO TABS: 5.0000 mg | ORAL_TABLET | ORAL | Status: DC | PRN

## 2014-01-16 MED ORDER — ACETAMINOPHEN 325 MG PO TABS: 650.0000 mg | ORAL_TABLET | Freq: Four times a day (QID) | ORAL | Status: DC | PRN

## 2014-01-16 MED ORDER — POTASSIUM CHLORIDE 10 MEQ/100ML IV SOLN
10.0000 meq | INTRAVENOUS | Status: AC
  Administered 2014-01-16 (×3): 10 meq via INTRAVENOUS
  Filled 2014-01-16 (×3): qty 100

## 2014-01-16 MED ORDER — ALUM & MAG HYDROXIDE-SIMETH 200-200-20 MG/5ML PO SUSP: 30.0000 mL | Freq: Four times a day (QID) | ORAL | Status: DC | PRN

## 2014-01-16 MED ORDER — SODIUM CHLORIDE 0.9 % IV SOLN
INTRAVENOUS | Status: DC
  Administered 2014-01-16: 04:00:00 via INTRAVENOUS

## 2014-01-16 MED ORDER — SODIUM CHLORIDE 0.9 % IV SOLN
INTRAVENOUS | Status: AC
  Administered 2014-01-16: 04:00:00 via INTRAVENOUS

## 2014-01-16 MED ORDER — POTASSIUM CHLORIDE IN NACL 40-0.9 MEQ/L-% IV SOLN
INTRAVENOUS | Status: DC
  Administered 2014-01-16: 12:00:00 via INTRAVENOUS
  Filled 2014-01-16 (×2): qty 1000

## 2014-01-16 MED ORDER — POTASSIUM CHLORIDE 20 MEQ/15ML (10%) PO LIQD
40.0000 meq | Freq: Once | ORAL | Status: AC
  Administered 2014-01-16: 40 meq via ORAL
  Filled 2014-01-16: qty 30

## 2014-01-16 MED ORDER — PANTOPRAZOLE SODIUM 40 MG PO TBEC
40.0000 mg | DELAYED_RELEASE_TABLET | Freq: Every day | ORAL | Status: DC
  Administered 2014-01-16: 40 mg via ORAL
  Filled 2014-01-16: qty 1

## 2014-01-16 MED ORDER — PANTOPRAZOLE SODIUM 40 MG PO TBEC: 40.0000 mg | DELAYED_RELEASE_TABLET | Freq: Every day | ORAL | Status: DC

## 2014-01-16 MED ORDER — POTASSIUM CHLORIDE 10 MEQ/100ML IV SOLN
10.0000 meq | Freq: Once | INTRAVENOUS | Status: DC
Start: 2014-01-16 — End: 2014-01-16
  Filled 2014-01-16: qty 100

## 2014-01-16 MED ORDER — POTASSIUM CHLORIDE 10 MEQ/100ML IV SOLN
10.0000 meq | Freq: Once | INTRAVENOUS | Status: AC
Start: 2014-01-16 — End: 2014-01-16
  Administered 2014-01-16: 10 meq via INTRAVENOUS
  Filled 2014-01-16: qty 100

## 2014-01-16 NOTE — Discharge Instructions (Signed)
Follow with Primary MD Kaleen MaskELKINS,WILSON OLIVER, MD in 2 days   Get CBC, CMP, checked 2 days by Primary MD and again as instructed by your Primary MD.     Activity: As tolerated with Full fall precautions use walker/cane & assistance as needed   Disposition Home     Diet: Heart Healthy    For Heart failure patients - Check your Weight same time everyday, if you gain over 2 pounds, or you develop in leg swelling, experience more shortness of breath or chest pain, call your Primary MD immediately. Follow Cardiac Low Salt Diet and 1.8 lit/day fluid restriction.   On your next visit with her primary care physician please Get Medicines reviewed and adjusted.  Please request your Prim.MD to go over all Hospital Tests and Procedure/Radiological results at the follow up, please get all Hospital records sent to your Prim MD by signing hospital release before you go home.   If you experience worsening of your admission symptoms, develop shortness of breath, life threatening emergency, suicidal or homicidal thoughts you must seek medical attention immediately by calling 911 or calling your MD immediately  if symptoms less severe.  You Must read complete instructions/literature along with all the possible adverse reactions/side effects for all the Medicines you take and that have been prescribed to you. Take any new Medicines after you have completely understood and accpet all the possible adverse reactions/side effects.   Do not drive and provide baby sitting services if your were admitted for syncope or siezures until you have seen by Primary MD or a Neurologist and advised to do so again.  Do not drive when taking Pain medications.    Do not take more than prescribed Pain, Sleep and Anxiety Medications  Special Instructions: If you have smoked or chewed Tobacco  in the last 2 yrs please stop smoking, stop any regular Alcohol  and or any Recreational drug use.  Wear Seat belts while  driving.   Please note  You were cared for by a hospitalist during your hospital stay. If you have any questions about your discharge medications or the care you received while you were in the hospital after you are discharged, you can call the unit and asked to speak with the hospitalist on call if the hospitalist that took care of you is not available. Once you are discharged, your primary care physician will handle any further medical issues. Please note that NO REFILLS for any discharge medications will be authorized once you are discharged, as it is imperative that you return to your primary care physician (or establish a relationship with a primary care physician if you do not have one) for your aftercare needs so that they can reassess your need for medications and monitor your lab values.

## 2014-01-16 NOTE — Progress Notes (Signed)
Utilization review completed.  P.J. Lucile Didonato,RN,BSN Case Manager 

## 2014-01-16 NOTE — ED Notes (Signed)
CRITICAL VALUE ALERT  Critical value received:  2.8 K+  Date of notification:  01/16/2014  Time of notification:  0032  Critical value read back:yes  Nurse who received alert: Cassie FreerKimberly Kamla Skilton, RN  MD notified (1st page):  Dr. Preston FleetingGlick EDP

## 2014-01-16 NOTE — H&P (Signed)
[facility] History and Physical  [name] ZOX:[id number] DOB: 04-07-1930 DOA: 01/15/2014  Referring physician:  EDP PCP: Kaleen Mask, MD  Specialists:   Chief Complaint: Nausea and Vomiting  HPI: [name] is a 80 y.o. female whad a Kyphoplasty due to a L-1 compression fracture done on 03/25 who presents to the ED with complaints of increased N+V  x 2 days.  She has not had diarrhea or constipation, or fevers or chills.  She has not been able to old down any foods or liquids at home.   She has had some heartburn type pain, and her emesis was dark and "dirty like" as described by her husband.  He reports that she had a remote history of a GI bleed in the past.  In the ED, she was found to have Potassium level of 2.8 and she was also found to be HEME Negative on an FOBT.     Her hemoglobin level was 11.6 on admission.  She was referred for medical admission.      Review of Systems:  Constitutional: No +Weight Loss, Night Sweats, Fevers, Chills, Fatigue, or +Generalized Weakness HEENT: No Headaches, Difficulty Swallowing,Tooth/Dental Problems,Sore Throat,  No Sneezing, Rhinitis, Ear Ache, Nasal Congestion, or Post Nasal Drip,  Cardio-vascular:  No Chest pain, Orthopnea, PND, Edema in lower extremities, Anasarca, Dizziness, Palpitations  Resp: No Dyspnea, No DOE, No Productive Cough, No Non-Productive Cough, No Hemoptysis, No Change in Color of Mucus,  No Wheezing.    GI: No Heartburn, Indigestion, Abdominal Pain, +Nausea, +Vomiting, Diarrhea, Change in Bowel Habits,  +Loss of Appetite  GU: No Dysuria, Change in Color of Urine, No Urgency or Frequency.  No flank pain.  Musculoskeletal: No Joint Pain or Swelling.  No Decreased Range of Motion, +Back Pain.  Neurologic: No Syncope, No Seizures, +Muscle Weakness, Paresthesia, Vision Disturbance or Loss, No Diplopia, No Vertigo, No Difficulty Walking,  Skin: No Rash or Lesions. Psych: No Change in Mood or Affect. No Depression or  Anxiety. No Memory loss. No Confusion or Hallucinations   Past Medical History  Diagnosis Date  . Hypertension   . High cholesterol   . Cardiomegaly     Hattie Perch 02/17/2013  . UTI (lower urinary tract infection)   . Gastric ulcer   . History of blood transfusion       Past Surgical History  Procedure Laterality Date  . No past surgeries    . Esophagogastroduodenoscopy endoscopy    . Kyphoplasty N/A 01/13/2014    Procedure: Lumbar one KYPHOPLASTY;  Surgeon: Barnett Abu, MD;  Location: [facility];  Service: Neurosurgery;  Laterality: N/A;  L1 KYPHOPLASTY       Prior to Admission medications   Medication Sig Start Date End Date Taking? Authorizing Provider  alendronate (FOSAMAX) 70 MG tablet Take 70 mg by mouth once a week.  01/12/14  Yes Historical Provider, MD  atenolol (TENORMIN) 100 MG tablet Take 100 mg by mouth daily.    Yes Historical Provider, MD  Calcium Carb-Cholecalciferol (CALCIUM 600+D3) 600-800 MG-UNIT TABS Take 2 tablets by mouth every evening.   Yes Historical Provider, MD  docusate sodium (COLACE) 100 MG capsule Take 300 mg by mouth at bedtime.    Yes Historical Provider, MD  ibuprofen (ADVIL,MOTRIN) 400 MG tablet Take 400 mg by mouth 2 (two) times daily as needed (pain).   Yes Historical Provider, MD  oxyCODONE-acetaminophen (PERCOCET/ROXICET) 5-325 MG per tablet Take 1 tablet by mouth 2 (two) times daily as needed for severe pain (pain).  Yes Historical Provider, MD  pravastatin (PRAVACHOL) 40 MG tablet Take 40 mg by mouth daily.   Yes Historical Provider, MD  promethazine (PHENERGAN) 25 MG tablet Take 25 mg by mouth every 6 (six) hours as needed for nausea.  01/15/14  Yes Historical Provider, MD  triamterene-hydrochlorothiazide (MAXZIDE) 75-50 MG per tablet Take 0.5 tablets by mouth daily.   Yes Historical Provider, MD  Vitamin D, Cholecalciferol, 1000 UNITS TABS Take 1,000 Units by mouth every evening.   Yes Historical Provider, MD      No Known  Allergies   Social History:  reports that she has been smoking Cigarettes.  She has a 7.2 pack-year smoking history. She has never used smokeless tobacco. She reports that she does not drink alcohol or use illicit drugs.     History reviewed. No pertinent family history.     Physical Exam:  GEN:  Pleasant Elderly Thin 80 y.o. Asian female  examined  and in no acute distress; cooperative with exam Filed Vitals:   01/16/14 0024 01/16/14 0026 01/16/14 0200 01/16/14 0230  BP: 157/56 128/52 146/48   Pulse: 75 82 78 79  Temp:      TempSrc:      Resp:   16 15  SpO2:   92% 92%   Blood pressure 146/48, pulse 79, temperature 98.2 F (36.8 C), temperature source Oral, resp. rate 15, SpO2 92.00%. PSYCH: She is alert and oriented x4; does not appear anxious does not appear depressed; affect is normal HEENT: Normocephalic and Atraumatic, Mucous membranes pink; PERRLA; EOM intact; Fundi:  Benign;  No scleral icterus, Nares: Patent, Oropharynx: Clear,  Fair Dentition, Neck:  FROM, no cervical lymphadenopathy nor thyromegaly or carotid bruit; no JVD; Breasts:: Not examined CHEST WALL: No tenderness CHEST: Normal respiration, clear to auscultation bilaterally HEART: Regular rate and rhythm; no murmurs rubs or gallops BACK: No kyphosis or scoliosis; no CVA tenderness ABDOMEN: Positive Bowel Sounds, Scaphoid, soft non-tender; no masses, no organomegaly.    Rectal Exam: Not done EXTREMITIES: No cyanosis, clubbing or edema; no ulcerations. Genitalia: not examined PULSES: 2+ and symmetric SKIN: Normal hydration no rash or ulceration CNS:  Alert and Oriented x 4,  No Focal Deficits.   Vascular: pulses palpable throughout    Labs on Admission:  Basic Metabolic Panel:  Recent Labs Lab 01/15/14 2335 01/15/14 2349  NA 138  --   K 2.6* 2.8*  CL 87*  --   GLUCOSE 177*  --   BUN 29*  --   CREATININE 1.20*  --    Liver Function Tests: No results found for this basename: AST, ALT, ALKPHOS,  BILITOT, PROT, ALBUMIN,  in the last 168 hours  Recent Labs Lab 01/15/14 2317  LIPASE 13   No results found for this basename: AMMONIA,  in the last 168 hours CBC:  Recent Labs Lab 01/15/14 2317 01/15/14 2335  WBC 15.6*  --   NEUTROABS 12.4*  --   HGB 11.6* 12.9  HCT 33.6* 38.0  MCV 88.2  --   PLT 298  --    Cardiac Enzymes: No results found for this basename: CKTOTAL, CKMB, CKMBINDEX, TROPONINI,  in the last 168 hours  BNP (last 3 results) No results found for this basename: PROBNP,  in the last 8760 hours CBG: No results found for this basename: GLUCAP,  in the last 168 hours  Radiological Exams on Admission: No results found.    Assessment/Plan:   80 y.o. female with  Principal Problem:   Hypokalemia  Active Problems:   Dehydration   Nausea and vomiting   Elevated lactic acid level   AKI (acute kidney injury)   Hypertension   Hyperlipemia   L1 vertebral fracture     1.  Hypokalemia- due to N+V, Replete K+ , and check Magnesium level. Replete if needed.    2.  Dehydration-  Gently Rehydrate.    3.   Nausea and Vomiting-   Due to Viral Illness or Ileus, or Narcotic Induced constipation.   PRN IV Anti-Emetics.   Check a KUB.     4.   Elevated Lactic Acid level-   Monitor trend. Benign ABD exam.    5.   AKI-  Rehydrate and monitor Trend of BUN/Cr.     6.   HTN-  IV Hydralazine PRN.    7.   Hyperlipidemia-     8.  L-1 Compression Fracture-  S/P Kyphoplasty.     9.   DVT prophylaxis with Lovenox.        Code Status:  FULL CODE     Family Communication:   Husband at Bedside  Disposition Plan:       Observation Telemetry   Time spent:  5060 Minutes  Ron ParkerJENKINS,Tanina Barb C [facility] Pager [phone]  If 7PM-7AM, please contact night-coverage [email or web address] Password [email or web address] 01/16/2014, 3:20 AM

## 2014-01-16 NOTE — Discharge Summary (Signed)
[name], is a 80 y.o. female  DOB 10/27/1929  MRN [id number].  Admission date:  01/15/2014  Admitting Physician  Ron Parker, MD  Discharge Date:  01/16/2014   Primary MD  Kaleen Mask, MD  Recommendations for primary care physician for things to follow:   Check bmp in 2 days   Admission Diagnosis  Orthostatic hypotension [458.0] Dehydration [276.51] Hypokalemia [276.8] Renal insufficiency [593.9] Elevated lactic acid level [276.2]   Discharge Diagnosis  Orthostatic hypotension [458.0] Dehydration [276.51] Hypokalemia [276.8] Renal insufficiency [593.9] Elevated lactic acid level [276.2]   Due to gastroenteritis  Principal Problem:   Hypokalemia Active Problems:   Hypertension   Hyperlipemia   L1 vertebral fracture   Dehydration   Nausea and vomiting   Elevated lactic acid level   AKI (acute kidney injury)      Past Medical History  Diagnosis Date  . Hypertension   . High cholesterol   . Cardiomegaly     Hattie Perch 02/17/2013  . UTI (lower urinary tract infection)   . Gastric ulcer   . History of blood transfusion     Past Surgical History  Procedure Laterality Date  . No past surgeries    . Esophagogastroduodenoscopy endoscopy    . Kyphoplasty N/A 01/13/2014    Procedure: Lumbar one KYPHOPLASTY;  Surgeon: Barnett Abu, MD;  Location: [facility];  Service: Neurosurgery;  Laterality: N/A;  L1 KYPHOPLASTY     Discharge Condition: Stable   Follow UP  Follow-up Information   Follow up with Kaleen Mask, MD. Schedule an appointment as soon as possible for a visit in 2 days.   Specialty:  Family Medicine   Contact information:   [address] [place] [address] [phone]       Follow up with Stan Head, MD. Schedule an appointment as soon as possible for a  visit in 1 week.   Specialty:  Gastroenterology   Contact information:   [address] [place] [address] [phone]         Discharge Instructions  and  Discharge Medications         Discharge Orders   Future Appointments Provider Department Dept Phone   01/19/2014 9:00 AM [id number] [facility] AT [address] [phone]   Please pick up your oral contrast prep at your scheduled location at least 1 day prior to your appointment. Liquids only 4 hours prior to your exam. Any medications can be taken as usual. Please arrive 15 min prior to your scheduled exam time.   02/18/2014 1:45 PM Willodean Rosenthal, MD [facility] [phone]   Future Orders Complete By Expires   Diet - low sodium heart healthy  As directed    Discharge instructions  As directed    Comments:     Follow with Primary MD Kaleen Mask, MD in 2 days   Get CBC, CMP, checked 2 days by Primary MD and again as instructed by your Primary MD.     Activity:  As tolerated with Full fall precautions use walker/cane & assistance as needed   Disposition Home     Diet: Heart Healthy    For Heart failure patients - Check your Weight same time everyday, if you gain over 2 pounds, or you develop in leg swelling, experience more shortness of breath or chest pain, call your Primary MD immediately. Follow Cardiac Low Salt Diet and 1.8 lit/day fluid restriction.   On your next visit with her primary care physician please Get Medicines reviewed and adjusted.  Please request your Prim.MD to go over all Hospital Tests and Procedure/Radiological results at the follow up, please get all Hospital records sent to your Prim MD by signing hospital release before you go home.   If you experience worsening of your admission symptoms, develop shortness of breath, life threatening emergency, suicidal or homicidal thoughts you must seek medical attention immediately by calling 911 or  calling your MD immediately  if symptoms less severe.  You Must read complete instructions/literature along with all the possible adverse reactions/side effects for all the Medicines you take and that have been prescribed to you. Take any new Medicines after you have completely understood and accpet all the possible adverse reactions/side effects.   Do not drive and provide baby sitting services if your were admitted for syncope or siezures until you have seen by Primary MD or a Neurologist and advised to do so again.  Do not drive when taking Pain medications.    Do not take more than prescribed Pain, Sleep and Anxiety Medications  Special Instructions: If you have smoked or chewed Tobacco  in the last 2 yrs please stop smoking, stop any regular Alcohol  and or any Recreational drug use.  Wear Seat belts while driving.   Please note  You were cared for by a hospitalist during your hospital stay. If you have any questions about your discharge medications or the care you received while you were in the hospital after you are discharged, you can call the unit and asked to speak with the hospitalist on call if the hospitalist that took care of you is not available. Once you are discharged, your primary care physician will handle any further medical issues. Please note that NO REFILLS for any discharge medications will be authorized once you are discharged, as it is imperative that you return to your primary care physician (or establish a relationship with a primary care physician if you do not have one) for your aftercare needs so that they can reassess your need for medications and monitor your lab values.   Increase activity slowly  As directed        Medication List         alendronate 70 MG tablet  Commonly known as:  FOSAMAX  Take 70 mg by mouth once a week.     atenolol 100 MG tablet  Commonly known as:  TENORMIN  Take 100 mg by mouth daily.     CALCIUM 600+D3 600-800 MG-UNIT Tabs   Generic drug:  Calcium Carb-Cholecalciferol  Take 2 tablets by mouth every evening.     docusate sodium 100 MG capsule  Commonly known as:  COLACE  Take 300 mg by mouth at bedtime.     ibuprofen 400 MG tablet  Commonly known as:  ADVIL,MOTRIN  Take 400 mg by mouth 2 (two) times daily as needed (pain).     ondansetron 4 MG tablet  Commonly known as:  ZOFRAN  Take 1 tablet (4  mg total) by mouth every 8 (eight) hours as needed for nausea or vomiting.     oxyCODONE-acetaminophen 5-325 MG per tablet  Commonly known as:  PERCOCET/ROXICET  Take 1 tablet by mouth 2 (two) times daily as needed for severe pain (pain).     pantoprazole 40 MG tablet  Commonly known as:  PROTONIX  Take 1 tablet (40 mg total) by mouth daily.     pravastatin 40 MG tablet  Commonly known as:  PRAVACHOL  Take 40 mg by mouth daily.     promethazine 25 MG tablet  Commonly known as:  PHENERGAN  Take 25 mg by mouth every 6 (six) hours as needed for nausea.     triamterene-hydrochlorothiazide 75-50 MG per tablet  Commonly known as:  MAXZIDE  Take 0.5 tablets by mouth daily.  Start taking on:  01/19/2014     Vitamin D (Cholecalciferol) 1000 UNITS Tabs  Take 1,000 Units by mouth every evening.          Diet and Activity recommendation: See Discharge Instructions above   Consults obtained -     Major procedures and Radiology Reports - PLEASE review detailed and final reports for all details, in brief -       Dg Lumbar Spine 2-3 Views  01/13/2014   CLINICAL DATA:  L1 kyphoplasty.  A pathologic fracture.  EXAM: DG C-ARM 1-60 MIN; LUMBAR SPINE - 2-3 VIEW  COMPARISON:  MR L SPINE W/O dated 01/05/2014  FINDINGS: Single frontal lateral intraoperative spot fluoroscopic images of the upper lumbar spine are provided. These demonstrate interval vertebral augmentation at L1. Severe L2 compression fracture is again seen.  IMPRESSION: Intraoperative images during L1 vertebral augmentation.   Electronically Signed    By: Sebastian AcheAllen  Grady   On: 01/13/2014 18:33    Dg Chest Port 1 View  01/16/2014   CLINICAL DATA:  Cough.  EXAM: PORTABLE CHEST - 1 VIEW  COMPARISON:  DG CHEST 2 VIEW dated 02/17/2013  FINDINGS: Stable cardiac and mediastinal contours. Minimal left basilar atelectasis. No large consolidative pulmonary opacity no pleural effusion or pneumothorax. Regional skeleton is unremarkable.  IMPRESSION: No acute cardiopulmonary process.  Minimal left basilar atelectasis.   Electronically Signed   By: Annia Beltrew  Davis M.D.   On: 01/16/2014 10:04   Dg Abd Portable 1v  01/16/2014   CLINICAL DATA:  Abdominal pain.  EXAM: PORTABLE ABDOMEN - 1 VIEW  COMPARISON:  DG LUMBAR SPINE COMPLETE dated 01/05/2014; MR L SPINE W/O dated 01/05/2014; DG LUMBAR SPINE COMPLETE dated 11/25/2013  FINDINGS: Gas is demonstrated within non dilated loops of large and small bowel in a nonobstructed pattern. Minimal left basilar atelectasis and or scarring. Multiple overlying leads. Supine evaluation limited for the detection of free intraperitoneal air. Re- demonstrated compression deformities involving the L1 and L2 vertebral bodies, better demonstrated on prior lumbar spine MRI.  IMPRESSION: Nonobstructed bowel gas pattern.   Electronically Signed   By: Annia Beltrew  Davis M.D.   On: 01/16/2014 10:08   Dg C-arm 1-60 Min  01/13/2014   CLINICAL DATA:  L1 kyphoplasty.  A pathologic fracture.  EXAM: DG C-ARM 1-60 MIN; LUMBAR SPINE - 2-3 VIEW  COMPARISON:  MR L SPINE W/O dated 01/05/2014  FINDINGS: Single frontal lateral intraoperative spot fluoroscopic images of the upper lumbar spine are provided. These demonstrate interval vertebral augmentation at L1. Severe L2 compression fracture is again seen.  IMPRESSION: Intraoperative images during L1 vertebral augmentation.   Electronically Signed   By: Sebastian AcheAllen  Grady  On: 01/13/2014 18:33      Micro Results      Recent Results (from the past 240 hour(s))  SURGICAL PCR SCREEN     Status: Abnormal   Collection Time     01/13/14  1:36 PM      Result Value Ref Range Status   MRSA, PCR NEGATIVE  NEGATIVE Final   Staphylococcus aureus POSITIVE (*) NEGATIVE Final   Comment:            The Xpert SA Assay (FDA     approved for NASAL specimens     in patients over 92 years of age),     is one component of     a comprehensive surveillance     program.  Test performance has     been validated by [employer] for patients greater     than or equal to 53 year old.     It is not intended     to diagnose infection nor to     guide or monitor treatment.     History of present illness and  Hospital Course:     Kindly see H&P for history of present illness and admission details, please review complete Labs, Consult reports and Test reports for all details in brief [name], is a 80 y.o. female, patient with history of Recent history of L1 spine surgery by Dr. Danielle Dess, hypertension, dyslipidemia, came in to the hospital with chief complaints of nausea and vomiting, emesis was heme occult negative, she had lab evidence of dehydration, history of present illness and mildly elevated lactate, KUB and chest x-ray were stable, symptoms were likely due to gastroenteritis.      Much improved after supportive care which included bowel rest, Zofran and IV fluids. Will be placed on Protonix, as needed Zofran with outpatient GI followup.      Hypokalemia has been adequately replaced. We'll request PCP to repeat BMP in 2 days.       History of present illness due to prerenal azotemia resolved after IV fluids.      HTN stable, will hold home diuretics for 2 more days, thereafter once she sees her PCP and labs remained stable she can resume her antihypertensive and diuretic.      Recent L1 kyphoplasty by Dr. Danielle Dess. Followup with Dr. Danielle Dess as before no change.       Today   Subjective:   Blaine Guiffre today has no headache,no chest abdominal pain,no new weakness tingling or numbness, feels much better  wants to go home today.    Objective:   Blood pressure 133/76, pulse 90, temperature 99 F (37.2 C), temperature source Oral, resp. rate 18, height 5\' 3"  (1.6 m), weight 45.7 kg (100 lb 12 oz), SpO2 95.00%.   Intake/Output Summary (Last 24 hours) at 01/16/14 1627 Last data filed at 01/16/14 1600  Gross per 24 hour  Intake   2485 ml  Output    950 ml  Net   1535 ml      Exam  Awake Alert, Oriented *3, No new F.N deficits, Normal affect [place].AT,PERRAL Supple Neck,No JVD, No cervical lymphadenopathy appriciated.  Symmetrical Chest wall movement, Good air movement bilaterally, CTAB RRR,No Gallops,Rubs or new Murmurs, No Parasternal Heave +ve B.Sounds, Abd Soft, Non tender, No organomegaly appriciated, No rebound -guarding or rigidity. No Cyanosis, Clubbing or edema, No new Rash or bruise   Data Review   CBC w Diff: Lab Results  Component Value  Date   WBC 12.7* 01/16/2014   HGB 10.0* 01/16/2014   HCT 29.8* 01/16/2014   PLT 249 01/16/2014   LYMPHOPCT 8* 01/15/2014   MONOPCT 6 01/15/2014   EOSPCT 0 01/15/2014   BASOPCT 0 01/15/2014    CMP: Lab Results  Component Value Date   NA 142 01/16/2014   K 3.4* 01/16/2014   CL 96 01/16/2014   CO2 31 01/16/2014   BUN 22 01/16/2014   CREATININE 0.73 01/16/2014   PROT 4.9* 03/22/2013   ALBUMIN 2.6* 03/22/2013   BILITOT 0.2* 03/22/2013   ALKPHOS 47 03/22/2013   AST 11 03/22/2013   ALT 9 03/22/2013  .   Total Time in preparing paper work, data evaluation and todays exam - 35 minutes  Leroy Sea M.D on 01/16/2014 at 4:27 PM  [facility] Office  [phone]

## 2014-01-16 NOTE — ED Notes (Signed)
Per pt and family - pt has been experiencing n/v that began yesterday evening, today family noted today that emesis looked like coffee grounds today. Pt w/ hx of gastric ulcers, denies diarrhea, fever or abd pain at present.

## 2014-01-19 ENCOUNTER — Ambulatory Visit
Admission: RE | Admit: 2014-01-19 | Discharge: 2014-01-19 | Disposition: A | Discharge status: Home / Self Care | Payer: Medicare Other | Source: Ambulatory Visit | Attending: Family Medicine | Admitting: Family Medicine

## 2014-01-19 DIAGNOSIS — N1330 Unspecified hydronephrosis: Secondary | ICD-10-CM

## 2014-01-26 ENCOUNTER — Other Ambulatory Visit: Payer: Self-pay | Admitting: Family Medicine

## 2014-01-27 ENCOUNTER — Other Ambulatory Visit: Payer: Self-pay | Admitting: Family Medicine

## 2014-01-27 DIAGNOSIS — M8448XA Pathological fracture, other site, initial encounter for fracture: Secondary | ICD-10-CM

## 2014-02-09 ENCOUNTER — Ambulatory Visit
Admission: RE | Admit: 2014-02-09 | Discharge: 2014-02-09 | Disposition: A | Discharge status: Home / Self Care | Payer: Medicare Other | Source: Ambulatory Visit | Attending: Family Medicine | Admitting: Family Medicine

## 2014-02-09 DIAGNOSIS — M8448XA Pathological fracture, other site, initial encounter for fracture: Secondary | ICD-10-CM

## 2014-02-12 ENCOUNTER — Encounter: Payer: Self-pay | Admitting: *Deleted

## 2014-02-18 ENCOUNTER — Encounter: Payer: Medicare Other | Admitting: Obstetrics & Gynecology

## 2015-05-29 IMAGING — RF DG C-ARM 61-120 MIN
1 series · 1 of 1 positions shown · non-contrast
Comparison: MR GLENN SPINE W/O dated 01/05/2014

CLINICAL DATA: L1 kyphoplasty.  A pathologic fracture.

EXAM:
DG C-ARM 1-60 MIN; LUMBAR SPINE - 2-3 VIEW

[Series 1: run · 1 of 1 slices shown]
[im 1/1]
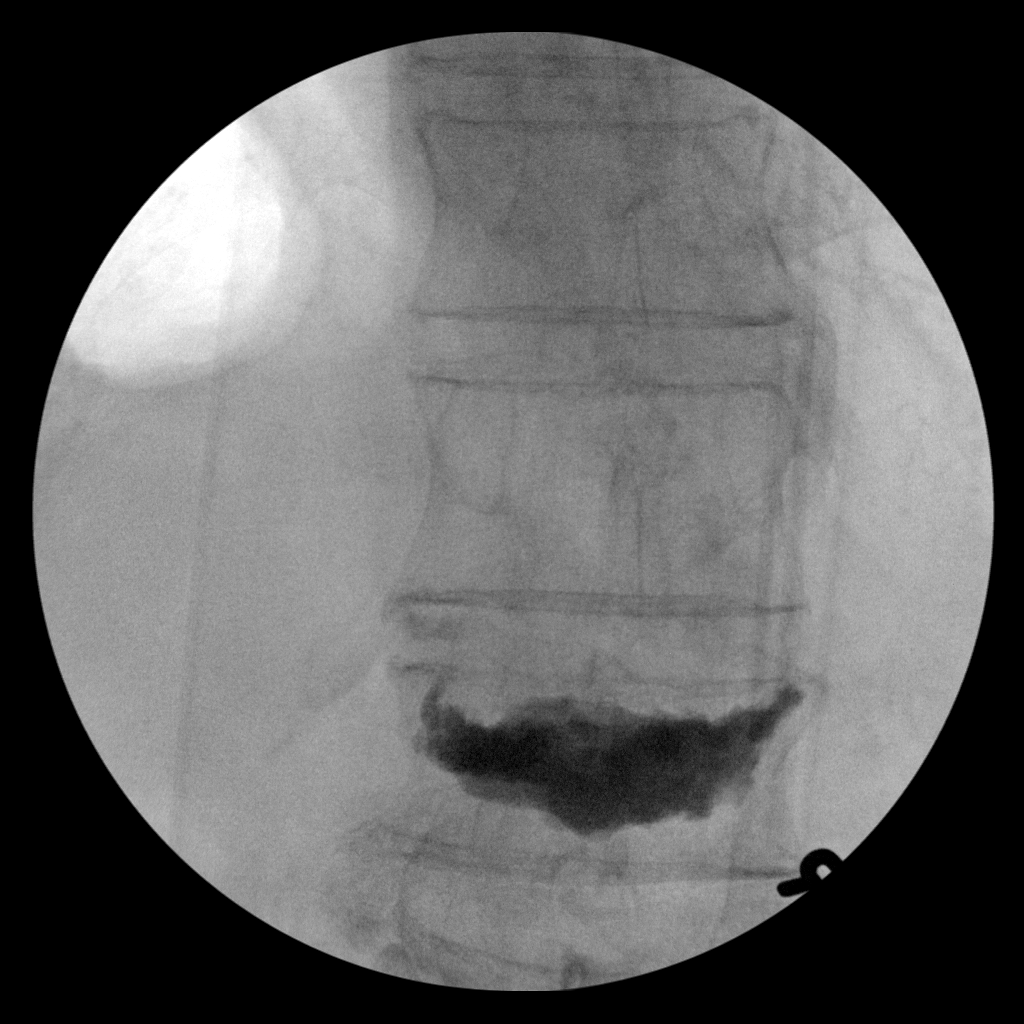

[1 of 1 positions shown; findings below may reference images not displayed]

FINDINGS: Single frontal lateral intraoperative spot fluoroscopic images of
the upper lumbar spine are provided. These demonstrate interval
vertebral augmentation at L1. Severe L2 compression fracture is
again seen.
IMPRESSION: Intraoperative images during L1 vertebral augmentation.

## 2018-12-24 ENCOUNTER — Other Ambulatory Visit: Payer: Self-pay | Admitting: Family Medicine

## 2018-12-24 DIAGNOSIS — M810 Age-related osteoporosis without current pathological fracture: Secondary | ICD-10-CM

## 2019-03-24 ENCOUNTER — Other Ambulatory Visit: Payer: Self-pay

## 2019-03-24 ENCOUNTER — Ambulatory Visit
Admission: RE | Admit: 2019-03-24 | Discharge: 2019-03-24 | Disposition: A | Discharge status: Home / Self Care | Payer: Medicare Other | Source: Ambulatory Visit | Attending: Family Medicine | Admitting: Family Medicine

## 2019-03-24 DIAGNOSIS — M810 Age-related osteoporosis without current pathological fracture: Secondary | ICD-10-CM

## 2019-11-17 ENCOUNTER — Observation Stay (HOSPITAL_COMMUNITY): Payer: Medicare Other

## 2019-11-17 ENCOUNTER — Encounter (HOSPITAL_COMMUNITY): Payer: Self-pay | Admitting: Emergency Medicine

## 2019-11-17 ENCOUNTER — Other Ambulatory Visit: Payer: Self-pay

## 2019-11-17 ENCOUNTER — Inpatient Hospital Stay (HOSPITAL_COMMUNITY)
Admission: EM | Admit: 2019-11-17 | Discharge: 2019-11-19 | DRG: 689 | Disposition: A | Discharge status: Home Health | Payer: Medicare Other | Attending: Internal Medicine | Admitting: Internal Medicine

## 2019-11-17 DIAGNOSIS — T502X5A Adverse effect of carbonic-anhydrase inhibitors, benzothiadiazides and other diuretics, initial encounter: Secondary | ICD-10-CM | POA: Diagnosis present

## 2019-11-17 DIAGNOSIS — E785 Hyperlipidemia, unspecified: Secondary | ICD-10-CM | POA: Diagnosis present

## 2019-11-17 DIAGNOSIS — Z20822 Contact with and (suspected) exposure to covid-19: Secondary | ICD-10-CM | POA: Diagnosis present

## 2019-11-17 DIAGNOSIS — Z7983 Long term (current) use of bisphosphonates: Secondary | ICD-10-CM

## 2019-11-17 DIAGNOSIS — R531 Weakness: Secondary | ICD-10-CM | POA: Diagnosis not present

## 2019-11-17 DIAGNOSIS — D649 Anemia, unspecified: Secondary | ICD-10-CM

## 2019-11-17 DIAGNOSIS — N390 Urinary tract infection, site not specified: Secondary | ICD-10-CM | POA: Diagnosis not present

## 2019-11-17 DIAGNOSIS — F17210 Nicotine dependence, cigarettes, uncomplicated: Secondary | ICD-10-CM | POA: Diagnosis present

## 2019-11-17 DIAGNOSIS — Z79891 Long term (current) use of opiate analgesic: Secondary | ICD-10-CM

## 2019-11-17 DIAGNOSIS — R5381 Other malaise: Secondary | ICD-10-CM

## 2019-11-17 DIAGNOSIS — I10 Essential (primary) hypertension: Secondary | ICD-10-CM | POA: Diagnosis present

## 2019-11-17 DIAGNOSIS — Z79899 Other long term (current) drug therapy: Secondary | ICD-10-CM

## 2019-11-17 DIAGNOSIS — Z87440 Personal history of urinary (tract) infections: Secondary | ICD-10-CM

## 2019-11-17 DIAGNOSIS — N179 Acute kidney failure, unspecified: Secondary | ICD-10-CM | POA: Diagnosis present

## 2019-11-17 DIAGNOSIS — E876 Hypokalemia: Secondary | ICD-10-CM | POA: Diagnosis present

## 2019-11-17 DIAGNOSIS — G9341 Metabolic encephalopathy: Secondary | ICD-10-CM | POA: Diagnosis present

## 2019-11-17 DIAGNOSIS — E872 Acidosis: Secondary | ICD-10-CM | POA: Diagnosis present

## 2019-11-17 DIAGNOSIS — E860 Dehydration: Secondary | ICD-10-CM | POA: Diagnosis present

## 2019-11-17 LAB — CBC WITH DIFFERENTIAL/PLATELET
Abs Immature Granulocytes: 0.03 10*3/uL (ref 0.00–0.07)
Basophils Absolute: 0.1 10*3/uL (ref 0.0–0.1)
Basophils Relative: 1 %
Eosinophils Absolute: 0 10*3/uL (ref 0.0–0.5)
Eosinophils Relative: 0 %
HCT: 31.1 % — ABNORMAL LOW (ref 36.0–46.0)
Hemoglobin: 9.5 g/dL — ABNORMAL LOW (ref 12.0–15.0)
Immature Granulocytes: 0 %
Lymphocytes Relative: 10 %
Lymphs Abs: 0.7 10*3/uL (ref 0.7–4.0)
MCH: 30 pg (ref 26.0–34.0)
MCHC: 30.5 g/dL (ref 30.0–36.0)
MCV: 98.1 fL (ref 80.0–100.0)
Monocytes Absolute: 0.7 10*3/uL (ref 0.1–1.0)
Monocytes Relative: 10 %
Neutro Abs: 5.8 10*3/uL (ref 1.7–7.7)
Neutrophils Relative %: 79 %
Platelets: 169 10*3/uL (ref 150–400)
RBC: 3.17 MIL/uL — ABNORMAL LOW (ref 3.87–5.11)
RDW: 12.7 % (ref 11.5–15.5)
WBC: 7.3 10*3/uL (ref 4.0–10.5)
nRBC: 0 % (ref 0.0–0.2)

## 2019-11-17 LAB — URINALYSIS, ROUTINE W REFLEX MICROSCOPIC
Bilirubin Urine: NEGATIVE
Glucose, UA: NEGATIVE mg/dL
Hgb urine dipstick: NEGATIVE
Ketones, ur: 5 mg/dL — AB
Nitrite: NEGATIVE
Protein, ur: >=300 mg/dL — AB
Specific Gravity, Urine: 1.012 (ref 1.005–1.030)
pH: 9 — ABNORMAL HIGH (ref 5.0–8.0)

## 2019-11-17 LAB — COMPREHENSIVE METABOLIC PANEL
ALT: 14 U/L (ref 0–44)
AST: 22 U/L (ref 15–41)
Albumin: 2.9 g/dL — ABNORMAL LOW (ref 3.5–5.0)
Alkaline Phosphatase: 46 U/L (ref 38–126)
Anion gap: 12 (ref 5–15)
BUN: 26 mg/dL — ABNORMAL HIGH (ref 8–23)
CO2: 17 mmol/L — ABNORMAL LOW (ref 22–32)
Calcium: 7.8 mg/dL — ABNORMAL LOW (ref 8.9–10.3)
Chloride: 111 mmol/L (ref 98–111)
Creatinine, Ser: 1.11 mg/dL — ABNORMAL HIGH (ref 0.44–1.00)
GFR calc Af Amer: 52 mL/min — ABNORMAL LOW (ref >60)
GFR calc non Af Amer: 45 mL/min — ABNORMAL LOW (ref >60)
Glucose, Bld: 108 mg/dL — ABNORMAL HIGH (ref 70–99)
Potassium: 3.1 mmol/L — ABNORMAL LOW (ref 3.5–5.1)
Sodium: 140 mmol/L (ref 135–145)
Total Bilirubin: 0.5 mg/dL (ref 0.3–1.2)
Total Protein: 6.2 g/dL — ABNORMAL LOW (ref 6.5–8.1)

## 2019-11-17 MED ORDER — POTASSIUM CHLORIDE CRYS ER 20 MEQ PO TBCR
40.0000 meq | EXTENDED_RELEASE_TABLET | Freq: Once | ORAL | Status: AC
  Administered 2019-11-17: 40 meq via ORAL
  Filled 2019-11-17: qty 2

## 2019-11-17 MED ORDER — SODIUM CHLORIDE 0.9 % IV SOLN
1.0000 g | INTRAVENOUS | Status: DC
  Administered 2019-11-17 – 2019-11-18 (×2): 1 g via INTRAVENOUS
  Filled 2019-11-17: qty 1
  Filled 2019-11-17 (×2): qty 10

## 2019-11-17 MED ORDER — ACETAMINOPHEN 325 MG PO TABS: 650.0000 mg | ORAL_TABLET | Freq: Four times a day (QID) | ORAL | Status: DC | PRN

## 2019-11-17 MED ORDER — ACETAMINOPHEN 650 MG RE SUPP: 650.0000 mg | Freq: Four times a day (QID) | RECTAL | Status: DC | PRN

## 2019-11-17 MED ORDER — NICOTINE 7 MG/24HR TD PT24
7.0000 mg | MEDICATED_PATCH | Freq: Every day | TRANSDERMAL | Status: DC
  Administered 2019-11-18: 7 mg via TRANSDERMAL
  Filled 2019-11-17 (×4): qty 1

## 2019-11-17 MED ORDER — HYDRALAZINE HCL 20 MG/ML IJ SOLN
5.0000 mg | INTRAMUSCULAR | Status: DC | PRN
  Administered 2019-11-17 – 2019-11-18 (×2): 5 mg via INTRAVENOUS
  Filled 2019-11-17 (×2): qty 1

## 2019-11-17 MED ORDER — SODIUM CHLORIDE 0.9 % IV SOLN: INTRAVENOUS | Status: DC

## 2019-11-17 MED ORDER — SODIUM CHLORIDE 0.9 % IV BOLUS
500.0000 mL | Freq: Once | INTRAVENOUS | Status: AC
  Administered 2019-11-17: 15:00:00 500 mL via INTRAVENOUS

## 2019-11-17 NOTE — ED Triage Notes (Signed)
Pt BIB [facility] from home. Per EMS pt has been having increasing generalized weakness x2 days. Per family pt having to use walker to get around and that is not normal for her. Pt with history of UTI and chronic back pain. A&Ox4. VSS. NAD.

## 2019-11-17 NOTE — ED Notes (Signed)
Pt removed IV catheter by herself. Bleeding controlled.

## 2019-11-17 NOTE — ED Notes (Signed)
Pt transported to CT ?

## 2019-11-17 NOTE — ED Provider Notes (Signed)
[facility] EMERGENCY DEPARTMENT Provider Note   CSN: [id number] Arrival date & time: 11/17/19  4401     History Chief Complaint  Patient presents with  . Weakness    [name] is a 86 y.o. female.  The history is provided by the patient. No language interpreter was used.  Weakness Severity:  Moderate Onset quality:  Gradual Duration:  1 week Timing:  Constant Progression:  Worsening Chronicity:  New Context: dehydration   Worsened by:  Nothing Ineffective treatments:  None tried Associated symptoms: no dizziness and no loss of consciousness   Risk factors: no anemia    Pt reports he family is worried about her because she is weaker than usual.  Pt reports she has had to use her walker. Pt denies any fever, no chills,  Pt has a history of uti's     Past Medical History:  Diagnosis Date  . Cardiomegaly    Hattie Perch 02/17/2013  . Gastric ulcer   . High cholesterol   . History of blood transfusion   . Hypertension   . UTI (lower urinary tract infection)     Patient Active Problem List   Diagnosis Date Noted  . Dehydration 01/16/2014  . Nausea and vomiting 01/16/2014  . Elevated lactic acid level 01/16/2014  . AKI (acute kidney injury) (HCC) 01/16/2014  . L1 vertebral fracture (HCC) 01/13/2014  . Protein-calorie malnutrition, severe (HCC) 03/24/2013  . Hypokalemia 02/17/2013  . Hypertension 02/17/2013  . Hyperlipemia 02/17/2013    Past Surgical History:  Procedure Laterality Date  . ESOPHAGOGASTRODUODENOSCOPY ENDOSCOPY    . KYPHOPLASTY N/A 01/13/2014   Procedure: Lumbar one KYPHOPLASTY;  Surgeon: Barnett Abu, MD;  Location: [facility];  Service: Neurosurgery;  Laterality: N/A;  L1 KYPHOPLASTY  . NO PAST SURGERIES       OB History   No obstetric history on file.     No family history on file.  Social History   Tobacco Use  . Smoking status: Current Every Day Smoker    Packs/day: 0.12    Years: 60.00    Pack years: 7.20   Types: Cigarettes  . Smokeless tobacco: Never Used  Substance Use Topics  . Alcohol use: No  . Drug use: No    Home Medications Prior to Admission medications   Medication Sig Start Date End Date Taking? Authorizing Provider  alendronate (FOSAMAX) 70 MG tablet Take 70 mg by mouth once a week.  01/12/14   [provider]  atenolol (TENORMIN) 100 MG tablet Take 100 mg by mouth daily.     [provider]  Calcium Carb-Cholecalciferol (CALCIUM 600+D3) 600-800 MG-UNIT TABS Take 2 tablets by mouth every evening.    [provider]  docusate sodium (COLACE) 100 MG capsule Take 300 mg by mouth at bedtime.     [provider]  ibuprofen (ADVIL,MOTRIN) 400 MG tablet Take 400 mg by mouth 2 (two) times daily as needed (pain).    [provider]  ondansetron (ZOFRAN) 4 MG tablet Take 1 tablet (4 mg total) by mouth every 8 (eight) hours as needed for nausea or vomiting. 01/16/14   Leroy Sea, MD  oxyCODONE-acetaminophen (PERCOCET/ROXICET) 5-325 MG per tablet Take 1 tablet by mouth 2 (two) times daily as needed for severe pain (pain).    [provider]  pantoprazole (PROTONIX) 40 MG tablet Take 1 tablet (40 mg total) by mouth daily. 01/16/14   Leroy Sea, MD  pravastatin (PRAVACHOL) 40 MG tablet Take  40 mg by mouth daily.    [provider]  promethazine (PHENERGAN) 25 MG tablet Take 25 mg by mouth every 6 (six) hours as needed for nausea.  01/15/14   [provider]  triamterene-hydrochlorothiazide (MAXZIDE) 75-50 MG per tablet Take 0.5 tablets by mouth daily. 01/19/14   Leroy Sea, MD  Vitamin D, Cholecalciferol, 1000 UNITS TABS Take 1,000 Units by mouth every evening.    [provider]    Allergies    Patient has no known allergies.  Review of Systems   Review of Systems  Neurological: Positive for weakness. Negative for dizziness and loss of consciousness.  All other systems reviewed and are  negative.   Physical Exam Updated Vital Signs BP (!) 169/63   Pulse 61   Temp 97.8 F (36.6 C) (Oral)   Resp (!) 22   Ht 5\' 2"  (1.575 m)   Wt 45.4 kg   SpO2 96%   BMI 18.29 kg/m   Physical Exam Vitals and nursing note reviewed.  Constitutional:      Appearance: She is well-developed.  HENT:     Head: Normocephalic.     Mouth/Throat:     Mouth: Mucous membranes are moist.  Eyes:     Pupils: Pupils are equal, round, and reactive to light.  Cardiovascular:     Rate and Rhythm: Normal rate.  Pulmonary:     Effort: Pulmonary effort is normal.  Abdominal:     General: There is no distension.  Musculoskeletal:        General: Normal range of motion.     Cervical back: Normal range of motion.  Skin:    General: Skin is warm.  Neurological:     General: No focal deficit present.     Mental Status: She is alert and oriented to person, place, and time.  Psychiatric:        Mood and Affect: Mood normal.     ED Results / Procedures / Treatments   Labs (all labs ordered are listed, but only abnormal results are displayed) Labs Reviewed  CBC WITH DIFFERENTIAL/PLATELET - Abnormal; Notable for the following components:      Result Value   RBC 3.17 (*)    Hemoglobin 9.5 (*)    HCT 31.1 (*)    All other components within normal limits  COMPREHENSIVE METABOLIC PANEL - Abnormal; Notable for the following components:   Potassium 3.1 (*)    CO2 17 (*)    Glucose, Bld 108 (*)    BUN 26 (*)    Creatinine, Ser 1.11 (*)    Calcium 7.8 (*)    Total Protein 6.2 (*)    Albumin 2.9 (*)    GFR calc non Af Amer 45 (*)    GFR calc Af Amer 52 (*)    All other components within normal limits  URINALYSIS, ROUTINE W REFLEX MICROSCOPIC    EKG EKG Interpretation  Date/Time:  Tuesday November 17 2019 11:08:16 EST Ventricular Rate:  64 PR Interval:    QRS Duration: 127 QT Interval:  461 QTC Calculation: 476 R Axis:   42 Text Interpretation: Sinus rhythm IVCD, consider atypical  RBBB Anteroseptal infarct, age indeterminate similar to 2014 Confirmed by 2015 [phone]) on 11/17/2019 11:19:55 AM   Radiology No results found.  Procedures Procedures (including critical care time)  Medications Ordered in ED Medications - No data to display  ED Course  I have reviewed the triage vital signs and the nursing notes.  Pertinent labs & imaging results that were available during my care of the patient were reviewed by me and considered in my medical decision making (see chart for details).    MDM Rules/Calculators/A&P                       Final Clinical Impression(s) / ED Diagnoses Final diagnoses:  None    Rx / DC Orders ED Discharge Orders    None       Sidney Ace 11/17/19 Savage, MD 11/18/19 [phone]

## 2019-11-17 NOTE — H&P (Signed)
History and Physical    [name] FKC:[id number] DOB: [birth year] DOA: 11/17/2019  PCP: Kaleen Mask, MD Patient coming from: Home  Chief Complaint: Generalized weakness  HPI: [name] is a 86 y.o. female with medical history significant of hypertension, hyperlipidemia, gastric ulcer, history of UTI, and conditions listed below presenting to the ED via EMS for evaluation of generalized weakness.  Per family report, patient has been very weak for the past 2 days and has been using a walker which is not normal for her.  Patient is currently oriented to person and place only.  Reports having generalized weakness for the past few days.  She has no other complaints.  Denies fevers, chills, cough, shortness of breath, chest pain, nausea, vomiting, abdominal pain, diarrhea, dysuria, or urinary frequency/urgency.  Denies any recent falls.  Denies melena or hematochezia.  No additional history could be obtained from her.  ED Course: Afebrile.  Labs showing no leukocytosis.  Hemoglobin 9.5 and MCV 98.  Hemoglobin was 10.0 on prior labs from 2015, no recent labs for comparison.  Potassium 3.1.  Bicarb 17, anion gap 12.  Blood glucose 108.  BUN 26, creatinine 1.1.  Creatinine was 0.7 on labs done in 2015.  UA with negative nitrite, large amount of leukocytes, 0-5 WBCs, and few bacteria on microscopic examination.  Head CT pending. Patient received a 500 cc normal saline bolus.  Review of Systems:  All systems reviewed and apart from history of presenting illness, are negative.  Past Medical History:  Diagnosis Date  . Cardiomegaly    [name] 02/17/2013  . Gastric ulcer   . High cholesterol   . History of blood transfusion   . Hypertension   . UTI (lower urinary tract infection)     Past Surgical History:  Procedure Laterality Date  . ESOPHAGOGASTRODUODENOSCOPY ENDOSCOPY    . KYPHOPLASTY N/A 01/13/2014   Procedure: Lumbar one KYPHOPLASTY;  Surgeon: Barnett Abu, MD;  Location: [facility];   Service: Neurosurgery;  Laterality: N/A;  L1 KYPHOPLASTY  . NO PAST SURGERIES       reports that she has been smoking cigarettes. She has a 7.20 pack-year smoking history. She has never used smokeless tobacco. She reports that she does not drink alcohol or use drugs.  No Known Allergies  History reviewed. No pertinent family history.  Prior to Admission medications   Medication Sig Start Date End Date Taking? Authorizing Provider  alendronate (FOSAMAX) 70 MG tablet Take 70 mg by mouth once a week.  01/12/14   [provider]  atenolol (TENORMIN) 100 MG tablet Take 100 mg by mouth daily.     [provider]  Calcium Carb-Cholecalciferol (CALCIUM 600+D3) 600-800 MG-UNIT TABS Take 2 tablets by mouth every evening.    [provider]  docusate sodium (COLACE) 100 MG capsule Take 300 mg by mouth at bedtime.     [provider]  ibuprofen (ADVIL,MOTRIN) 400 MG tablet Take 400 mg by mouth 2 (two) times daily as needed (pain).    [provider]  ondansetron (ZOFRAN) 4 MG tablet Take 1 tablet (4 mg total) by mouth every 8 (eight) hours as needed for nausea or vomiting. 01/16/14   Leroy Sea, MD  oxyCODONE-acetaminophen (PERCOCET/ROXICET) 5-325 MG per tablet Take 1 tablet by mouth 2 (two) times daily as needed for severe pain (pain).    [provider]  pantoprazole (PROTONIX) 40 MG tablet Take 1 tablet (40 mg total) by mouth daily. 01/16/14   Thedore Mins,  Stanford Scotland, MD  pravastatin (PRAVACHOL) 40 MG tablet Take 40 mg by mouth daily.    [provider]  promethazine (PHENERGAN) 25 MG tablet Take 25 mg by mouth every 6 (six) hours as needed for nausea.  01/15/14   [provider]  triamterene-hydrochlorothiazide (MAXZIDE) 75-50 MG per tablet Take 0.5 tablets by mouth daily. 01/19/14   Leroy Sea, MD  Vitamin D, Cholecalciferol, 1000 UNITS TABS Take 1,000 Units by mouth every evening.    [provider]    Physical  Exam: Vitals:   11/17/19 1830 11/17/19 1921 11/17/19 1930 11/17/19 2019  BP: (!) 175/71 (!) 153/134 (!) 170/69   Pulse: 73 73 73 73  Resp: 20 14 14 17   Temp:      TempSrc:      SpO2: 96% 97% 97% 97%  Weight:      Height:        Physical Exam  Constitutional: She appears well-developed and well-nourished. No distress.  HENT:  Head: Normocephalic.  Dry mucous membranes  Eyes: Right eye exhibits no discharge. Left eye exhibits no discharge.  Cardiovascular: Normal rate, regular rhythm and intact distal pulses.  Pulmonary/Chest: Effort normal and breath sounds normal. No respiratory distress. She has no wheezes. She has no rales.  Coughing  Abdominal: Soft. Bowel sounds are normal. She exhibits no distension. There is no abdominal tenderness. There is no guarding.  Musculoskeletal:        General: No edema.     Cervical back: Neck supple.  Neurological:  Awake and alert Oriented to person and place only Speech fluent, tongue midline, no facial droop Strength 5 out of 5 in bilateral upper and lower extremities. Sensation to light touch intact throughout.  Skin: Skin is warm and dry. She is not diaphoretic.  Decreased skin turgor     Labs on Admission: I have personally reviewed following labs and imaging studies  CBC: Recent Labs  Lab 11/17/19 1048  WBC 7.3  NEUTROABS 5.8  HGB 9.5*  HCT 31.1*  MCV 98.1  PLT 169   Basic Metabolic Panel: Recent Labs  Lab 11/17/19 1048  NA 140  K 3.1*  CL 111  CO2 17*  GLUCOSE 108*  BUN 26*  CREATININE 1.11*  CALCIUM 7.8*   GFR: Estimated Creatinine Clearance: 25.6 mL/min (A) (by C-G formula based on SCr of 1.11 mg/dL (H)). Liver Function Tests: Recent Labs  Lab 11/17/19 1048  AST 22  ALT 14  ALKPHOS 46  BILITOT 0.5  PROT 6.2*  ALBUMIN 2.9*   No results for input(s): LIPASE, AMYLASE in the last 168 hours. No results for input(s): AMMONIA in the last 168 hours. Coagulation Profile: No results for input(s): INR,  PROTIME in the last 168 hours. Cardiac Enzymes: No results for input(s): CKTOTAL, CKMB, CKMBINDEX, TROPONINI in the last 168 hours. BNP (last 3 results) No results for input(s): PROBNP in the last 8760 hours. HbA1C: No results for input(s): HGBA1C in the last 72 hours. CBG: No results for input(s): GLUCAP in the last 168 hours. Lipid Profile: No results for input(s): CHOL, HDL, LDLCALC, TRIG, CHOLHDL, LDLDIRECT in the last 72 hours. Thyroid Function Tests: No results for input(s): TSH, T4TOTAL, FREET4, T3FREE, THYROIDAB in the last 72 hours. Anemia Panel: No results for input(s): VITAMINB12, FOLATE, FERRITIN, TIBC, IRON, RETICCTPCT in the last 72 hours. Urine analysis:    Component Value Date/Time   COLORURINE YELLOW 11/17/2019 1500   APPEARANCEUR CLOUDY (A) 11/17/2019 1500   LABSPEC 1.012 11/17/2019  [place] 9.0 (H) 11/17/2019 1500   GLUCOSEU NEGATIVE 11/17/2019 1500   HGBUR NEGATIVE 11/17/2019 1500   BILIRUBINUR NEGATIVE 11/17/2019 1500   KETONESUR 5 (A) 11/17/2019 1500   PROTEINUR >=300 (A) 11/17/2019 1500   UROBILINOGEN 0.2 03/22/2013 0557   NITRITE NEGATIVE 11/17/2019 1500   LEUKOCYTESUR LARGE (A) 11/17/2019 1500    Radiological Exams on Admission: DG Chest 2 View  Result Date: 11/17/2019 CLINICAL DATA:  Generalized weakness.  Hypertension. EXAM: CHEST - 2 VIEW COMPARISON:  January 16, 2014 FINDINGS: There is slight left midlung atelectatic change. Lungs elsewhere are clear. Heart size and pulmonary vascularity are normal. No adenopathy. There is degenerative change in the thoracic spine. Bones are osteoporotic. Patient has undergone previous kyphoplasty procedure in the upper lumbar region. There is marked collapse of an upper lumbar vertebral body. IMPRESSION: No edema or consolidation. Mild atelectasis left mid lung. No adenopathy. Bones osteoporotic. Electronically Signed   By: Lowella Grip III M.D.   On: 11/17/2019 20:24   CT Head Wo Contrast  Result Date:  11/17/2019 CLINICAL DATA:  Increasing generalized weakness for 2 days. EXAM: CT HEAD WITHOUT CONTRAST TECHNIQUE: Contiguous axial images were obtained from the base of the skull through the vertex without intravenous contrast. COMPARISON:  None. FINDINGS: Brain: There is no evidence for acute hemorrhage, hydrocephalus, mass lesion, or abnormal extra-axial fluid collection. No definite CT evidence for acute infarction. Diffuse loss of parenchymal volume is consistent with atrophy. Patchy low attenuation in the deep hemispheric and periventricular white matter is nonspecific, but likely reflects chronic microvascular ischemic demyelination. Vascular: No hyperdense vessel or unexpected calcification. Skull: No evidence for fracture. No worrisome lytic or sclerotic lesion. Sinuses/Orbits: Assessment motion degraded without gross opacification. Visualized portions of the globes and intraorbital fat are unremarkable. Other: None. IMPRESSION: 1. No acute intracranial abnormality. 2. Atrophy with chronic small vessel white matter ischemic disease. Electronically Signed   By: Misty Stanley M.D.   On: 11/17/2019 20:02    EKG: Independently reviewed.  Sinus rhythm, no significant change since prior tracing.  Assessment/Plan Principal Problem:   UTI (urinary tract infection) Active Problems:   Hypokalemia   Generalized weakness   Acute metabolic encephalopathy   Anemia   Possible UTI Afebrile.  Labs showing no leukocytosis.  No signs of sepsis. UA with negative nitrite, large amount of leukocytes, 0-5 WBCs, and few bacteria on microscopic examination.  -Ceftriaxone -Urine culture  Generalized weakness Could possibly be due to UTI and dehydration.  UA with large amount of leukocytes.  BUN and creatinine elevated compared to prior labs.  Appears dehydrated on exam.  Chest x-ray not suggestive of pneumonia. -Gentle IV fluid hydration -Management of UTI as mentioned above -Check TSH, B12 levels -PT and OT  evaluation -Fall precautions -SARS-CoV-2 PCR test pending.  Continue airborne and contact precautions.  Acute metabolic encephalopathy Suspect related to dehydration and possible UTI.  Head CT negative for acute intracranial abnormality.  Neuro exam nonfocal. -Management of UTI -IV fluid hydration -Check TSH, B12, ammonia levels  Normocytic anemia Hemoglobin 9.5 and MCV 98.  Hemoglobin was 10.0 on prior labs from 2015, no recent labs for comparison.  Patient denies melena or hematochezia.  No prior colonoscopy results in the chart. -Check FOBT -Anemia panel  Mild hypokalemia Suspect related to home diuretic use and possibly due to decreased p.o. intake.  Potassium 3.9. -Replete potassium.  Check magnesium level and replete if low.  Continue to monitor electrolytes.  Normal anion gap metabolic acidosis  Possibly due to home diuretic use. Bicarb 17, anion gap 12.   -IV fluid hydration -Continue to monitor -Hold diuretic at this time  AKI Suspect prerenal due to dehydration and home diuretic use.  BUN 26, creatinine 1.1.  Creatinine was 0.7 on labs done in 2015.    -IV fluid hydration -Continue to monitor renal function and urine output  Hypertension Blood pressure elevated with systolic up to 180s. -Hydralazine as needed for SBP >160  Tobacco use -NicoDerm patch -Counseling  Pharmacy med rec pending.  DVT prophylaxis: SCDs at this time, FOBT pending Code Status: Full code Family Communication: No family available at this time. Disposition Plan: Anticipate discharge after clinical improvement. Consults called: PT and OT Admission status: It is my clinical opinion that referral for OBSERVATION is reasonable and necessary in this patient based on the above information provided. The aforementioned taken together are felt to place the patient at high risk for further clinical deterioration. However it is anticipated that the patient may be medically stable for discharge from the  hospital within 24 to 48 hours.  The medical decision making on this patient was of high complexity and the patient is at high risk for clinical deterioration, therefore this is a level 3 visit.  John Giovanni MD [facility]  If 7PM-7AM, please contact night-coverage [email or web address] Password [facility]  11/17/2019, 8:56 PM

## 2019-11-17 NOTE — ED Notes (Signed)
Per Dr. Loney Loh at bedside, RN to hold on Occult Stool collect. Pt denies any blood in stool.

## 2019-11-17 NOTE — ED Notes (Signed)
Pt ambulated with 2 person assist, needed a lot of assistance. Unable to walk alone. Unsteady gait and balance.

## 2019-11-17 NOTE — ED Notes (Signed)
Pt transported to XR.  

## 2019-11-18 DIAGNOSIS — G9341 Metabolic encephalopathy: Secondary | ICD-10-CM | POA: Diagnosis present

## 2019-11-18 DIAGNOSIS — F17210 Nicotine dependence, cigarettes, uncomplicated: Secondary | ICD-10-CM | POA: Diagnosis present

## 2019-11-18 DIAGNOSIS — N390 Urinary tract infection, site not specified: Secondary | ICD-10-CM | POA: Diagnosis present

## 2019-11-18 DIAGNOSIS — Z79891 Long term (current) use of opiate analgesic: Secondary | ICD-10-CM | POA: Diagnosis not present

## 2019-11-18 DIAGNOSIS — R531 Weakness: Secondary | ICD-10-CM

## 2019-11-18 DIAGNOSIS — E872 Acidosis: Secondary | ICD-10-CM | POA: Diagnosis present

## 2019-11-18 DIAGNOSIS — Z87440 Personal history of urinary (tract) infections: Secondary | ICD-10-CM | POA: Diagnosis not present

## 2019-11-18 DIAGNOSIS — D649 Anemia, unspecified: Secondary | ICD-10-CM | POA: Diagnosis present

## 2019-11-18 DIAGNOSIS — E785 Hyperlipidemia, unspecified: Secondary | ICD-10-CM | POA: Diagnosis present

## 2019-11-18 DIAGNOSIS — Z20822 Contact with and (suspected) exposure to covid-19: Secondary | ICD-10-CM | POA: Diagnosis present

## 2019-11-18 DIAGNOSIS — E876 Hypokalemia: Secondary | ICD-10-CM | POA: Diagnosis present

## 2019-11-18 DIAGNOSIS — E860 Dehydration: Secondary | ICD-10-CM | POA: Diagnosis present

## 2019-11-18 DIAGNOSIS — Z7983 Long term (current) use of bisphosphonates: Secondary | ICD-10-CM | POA: Diagnosis not present

## 2019-11-18 DIAGNOSIS — Z79899 Other long term (current) drug therapy: Secondary | ICD-10-CM | POA: Diagnosis not present

## 2019-11-18 DIAGNOSIS — N179 Acute kidney failure, unspecified: Secondary | ICD-10-CM | POA: Diagnosis present

## 2019-11-18 DIAGNOSIS — T502X5A Adverse effect of carbonic-anhydrase inhibitors, benzothiadiazides and other diuretics, initial encounter: Secondary | ICD-10-CM | POA: Diagnosis present

## 2019-11-18 DIAGNOSIS — I10 Essential (primary) hypertension: Secondary | ICD-10-CM | POA: Diagnosis present

## 2019-11-18 LAB — MAGNESIUM: Magnesium: 2 mg/dL (ref 1.7–2.4)

## 2019-11-18 LAB — BASIC METABOLIC PANEL
Anion gap: 12 (ref 5–15)
BUN: 25 mg/dL — ABNORMAL HIGH (ref 8–23)
CO2: 21 mmol/L — ABNORMAL LOW (ref 22–32)
Calcium: 9 mg/dL (ref 8.9–10.3)
Chloride: 110 mmol/L (ref 98–111)
Creatinine, Ser: 1.07 mg/dL — ABNORMAL HIGH (ref 0.44–1.00)
GFR calc Af Amer: 54 mL/min — ABNORMAL LOW (ref >60)
GFR calc non Af Amer: 47 mL/min — ABNORMAL LOW (ref >60)
Glucose, Bld: 92 mg/dL (ref 70–99)
Potassium: 4.4 mmol/L (ref 3.5–5.1)
Sodium: 143 mmol/L (ref 135–145)

## 2019-11-18 LAB — CBC
HCT: 37.5 % (ref 36.0–46.0)
Hemoglobin: 12 g/dL (ref 12.0–15.0)
MCH: 29.9 pg (ref 26.0–34.0)
MCHC: 32 g/dL (ref 30.0–36.0)
MCV: 93.5 fL (ref 80.0–100.0)
Platelets: 222 10*3/uL (ref 150–400)
RBC: 4.01 MIL/uL (ref 3.87–5.11)
RDW: 12.5 % (ref 11.5–15.5)
WBC: 7.1 10*3/uL (ref 4.0–10.5)
nRBC: 0 % (ref 0.0–0.2)

## 2019-11-18 LAB — TSH: TSH: 0.781 u[IU]/mL (ref 0.350–4.500)

## 2019-11-18 LAB — IRON AND TIBC
Iron: 19 ug/dL — ABNORMAL LOW (ref 28–170)
Saturation Ratios: 9 % — ABNORMAL LOW (ref 10.4–31.8)
TIBC: 220 ug/dL — ABNORMAL LOW (ref 250–450)
UIBC: 201 ug/dL

## 2019-11-18 LAB — SARS CORONAVIRUS 2 (TAT 6-24 HRS): SARS Coronavirus 2: NEGATIVE

## 2019-11-18 LAB — RETICULOCYTES
Immature Retic Fract: 10.4 % (ref 2.3–15.9)
RBC.: 3.93 MIL/uL (ref 3.87–5.11)
Retic Count, Absolute: 55.4 10*3/uL (ref 19.0–186.0)
Retic Ct Pct: 1.4 % (ref 0.4–3.1)

## 2019-11-18 LAB — AMMONIA: Ammonia: 30 umol/L (ref 9–35)

## 2019-11-18 LAB — FERRITIN: Ferritin: 426 ng/mL — ABNORMAL HIGH (ref 11–307)

## 2019-11-18 LAB — VITAMIN B12: Vitamin B-12: 799 pg/mL (ref 180–914)

## 2019-11-18 LAB — FOLATE: Folate: 45 ng/mL (ref >5.9)

## 2019-11-18 MED ORDER — SODIUM CHLORIDE 0.9 % IV SOLN: INTRAVENOUS | Status: DC

## 2019-11-18 MED ORDER — SODIUM CHLORIDE 0.9 % IV SOLN
510.0000 mg | Freq: Once | INTRAVENOUS | Status: AC
  Administered 2019-11-18: 510 mg via INTRAVENOUS
  Filled 2019-11-18: qty 17

## 2019-11-18 MED ORDER — ATENOLOL 50 MG PO TABS
100.0000 mg | ORAL_TABLET | Freq: Every day | ORAL | Status: DC
  Administered 2019-11-18 – 2019-11-19 (×2): 100 mg via ORAL
  Filled 2019-11-18 (×2): qty 2

## 2019-11-18 MED ORDER — LISINOPRIL 10 MG PO TABS
10.0000 mg | ORAL_TABLET | Freq: Every day | ORAL | Status: DC
  Administered 2019-11-18 – 2019-11-19 (×2): 10 mg via ORAL
  Filled 2019-11-18 (×2): qty 1

## 2019-11-18 MED ORDER — FAMOTIDINE 20 MG PO TABS
20.0000 mg | ORAL_TABLET | Freq: Every day | ORAL | Status: DC
  Administered 2019-11-18: 20 mg via ORAL
  Filled 2019-11-18: qty 1

## 2019-11-18 MED ORDER — TRAMADOL HCL 50 MG PO TABS: 50.0000 mg | ORAL_TABLET | Freq: Every day | ORAL | Status: DC | PRN

## 2019-11-18 MED ORDER — TRAMADOL HCL 50 MG PO TABS
50.0000 mg | ORAL_TABLET | Freq: Every day | ORAL | Status: DC
  Administered 2019-11-18 – 2019-11-19 (×2): 50 mg via ORAL
  Filled 2019-11-18 (×2): qty 1

## 2019-11-18 NOTE — Progress Notes (Signed)
New Admission Note: ? Arrival Method: Via stretcher Mental Orientation: A/O x 3 Telemetry: N/A Assessment: Completed Skin: Refer to flowsheet IV: Right FA Pain: 0/10 Tubes: Safety Measures: Safety Fall Prevention Plan discussed with patient. Admission: Completed [facility] [place] Orientation: Patient has been orientated to the room, unit and the staff. Family: Orders have been reviewed and are being implemented. Will continue to monitor the patient. Call light has been placed within reach and bed alarm has been activated.  ? [employer], RN [phone]

## 2019-11-18 NOTE — Plan of Care (Signed)
  Problem: Education: Goal: Knowledge of General Education information will improve Description Including pain rating scale, medication(s)/side effects and non-pharmacologic comfort measures Outcome: Progressing   

## 2019-11-18 NOTE — ED Notes (Addendum)
Pt transported to RM. 21 on 74M with all belongings via cart transport. Pt alert, able to speak in full sentences. Breathing easy, non-labored. VSS. Equal rise and fall of chest

## 2019-11-18 NOTE — ED Notes (Signed)
Pt remains resting on cart in NAD. Breathing easy, non-labored. equal rise and fall of chest noted. VSS. Call light within reach. Hourly rounds completed. Will continue to monitor

## 2019-11-18 NOTE — Evaluation (Signed)
Occupational Therapy Evaluation Patient Details Name: [name] MRN: [id number] DOB: September 03, 1930 Today's Date: 11/18/2019    History of Present Illness Pt is a 86 y/o female with PMH of HTN, gastric ulcer, UTI, presenting to ED for evaluation of generalized weakness x 2 days. Found with UTI and dehydration.    Clinical Impression   PTA patient independent with ADls, IADLs, not driving.  Admitted for above and limited by problem list below, including impaired balance, decreased activity tolerance, generalized weakness, and impaired cognition.  She has no awareness of time (but spouse reports this because she doesn't pay attention to it), follows commands with increased time, has decreased awareness of safety and RW mgmt, and has decreased short term memory (spouse reports some at baseline, but has been worse recently).  Patient currently requires min assist for LB ADLs, min guard for transfers, min guard for grooming at sink, and min guard to min assist for in room mobility with RW.  She will benefit from continued OT services while admitted and after dc at [facility] level in order to optimize independence, safety with ADLs, IADLs and mobility.     Follow Up Recommendations  Home health OT;Supervision/Assistance - 24 hour    Equipment Recommendations  3 in 1 bedside commode    Recommendations for Other Services       Precautions / Restrictions Precautions Precautions: Fall Restrictions Weight Bearing Restrictions: No      Mobility Bed Mobility Overal bed mobility: Needs Assistance Bed Mobility: Supine to Sit;Sit to Supine     Supine to sit: Min assist Sit to supine: Min guard   General bed mobility comments: min assist for trunk support to EOB and increased time to scoot foward, min guard to return supine   Transfers Overall transfer level: Needs assistance Equipment used: Rolling walker (2 wheeled);1 person hand held assist Transfers: Sit to/from Stand Sit to Stand: Min guard          General transfer comment: min guard to steady, cueing for hand placement and safety     Balance Overall balance assessment: Needs assistance Sitting-balance support: No upper extremity supported;Feet supported Sitting balance-Leahy Scale: Fair     Standing balance support: Bilateral upper extremity supported;No upper extremity supported;During functional activity Standing balance-Leahy Scale: Poor Standing balance comment: relaint on BUE support dynamically but able to groom at sink with min guard and 0 hand support                           ADL either performed or assessed with clinical judgement   ADL Overall ADL's : Needs assistance/impaired     Grooming: Min guard;Standing;Oral care   Upper Body Bathing: Set up;Sitting   Lower Body Bathing: Minimal assistance;Sit to/from stand   Upper Body Dressing : Set up;Supervision/safety;Sitting   Lower Body Dressing: Minimal assistance;Sit to/from stand Lower Body Dressing Details (indicate cue type and reason): assist to manage L sock, but can manage R sock; min guard sit to stand  Toilet Transfer: Min guard;Ambulation;RW           Functional mobility during ADLs: Min guard;Minimal assistance;Rolling walker;Cueing for safety;Cueing for sequencing General ADL Comments: pt limited by impaired cognition, decreased activity tolerance and generalized weakness      Vision   Vision Assessment?: No apparent visual deficits     Perception     Praxis      Pertinent Vitals/Pain Pain Assessment: No/denies pain     Hand Dominance Right  Extremity/Trunk Assessment Upper Extremity Assessment Upper Extremity Assessment: Generalized weakness   Lower Extremity Assessment Lower Extremity Assessment: Defer to PT evaluation   Cervical / Trunk Assessment Cervical / Trunk Assessment: Kyphotic   Communication Communication Communication: No difficulties   Cognition Arousal/Alertness: Awake/alert Behavior  During Therapy: WFL for tasks assessed/performed Overall Cognitive Status: Impaired/Different from baseline Area of Impairment: Attention;Memory;Awareness;Problem solving;Following commands;Safety/judgement;Orientation                 Orientation Level: Disoriented to;Time Current Attention Level: Sustained Memory: Decreased short-term memory Following Commands: Follows one step commands consistently;Follows one step commands with increased time;Follows multi-step commands inconsistently Safety/Judgement: Decreased awareness of safety;Decreased awareness of deficits Awareness: Emergent Problem Solving: Slow processing;Requires verbal cues General Comments: husband reports STM decline slow progression, no awareness of time (but spouse reports she doens't pay attention to this), and requires increased time for processing, cueing for problem solving and safety    General Comments  pt is demonstrating no obvious skin issues, but has generalized LE weakness    Exercises Exercises: Other exercises(4- hip abd, 3+ hams and 4- DF)   Shoulder Instructions      Home Living Family/patient expects to be discharged to:: Private residence Living Arrangements: Spouse/significant other Available Help at Discharge: Family Type of Home: House Home Access: Stairs to enter [employer] of Steps: 1   Home Layout: One level     Bathroom Shower/Tub: Tub/shower unit;Walk-in shower   Bathroom Toilet: Standard     Home Equipment: Environmental consultant - 2 wheels;Walker - 4 wheels   Additional Comments: husband reports she has a rollator at home      Prior Functioning/Environment Level of Independence: Independent        Comments: no driving, completes IADLs         OT Problem List: Decreased strength;Decreased activity tolerance;Impaired balance (sitting and/or standing);Decreased cognition;Decreased safety awareness;Decreased knowledge of use of DME or AE;Decreased knowledge of  precautions      OT Treatment/Interventions: Self-care/ADL training;Therapeutic exercise;DME and/or AE instruction;Therapeutic activities;Balance training;Patient/family education;Cognitive remediation/compensation;Energy conservation    OT Goals(Current goals can be found in the care plan section) Acute Rehab OT Goals Patient Stated Goal: to go home OT Goal Formulation: With patient Time For Goal Achievement: 12/02/19 Potential to Achieve Goals: Good  OT Frequency: Min 2X/week   Barriers to D/C:            Co-evaluation              AM-PAC OT "6 Clicks" Daily Activity     Outcome Measure Help from another person eating meals?: None Help from another person taking care of personal grooming?: A Little Help from another person toileting, which includes using toliet, bedpan, or urinal?: A Little Help from another person bathing (including washing, rinsing, drying)?: A Little Help from another person to put on and taking off regular upper body clothing?: None Help from another person to put on and taking off regular lower body clothing?: A Little 6 Click Score: 20   End of Session Equipment Utilized During Treatment: Gait belt;Rolling walker Nurse Communication: Mobility status  Activity Tolerance: Patient tolerated treatment well Patient left: in bed;with call bell/phone within reach;with family/visitor present  OT Visit Diagnosis: Other abnormalities of gait and mobility (R26.89);Muscle weakness (generalized) (M62.81);Other symptoms and signs involving cognitive function                Time: 1310-1330 OT Time Calculation (min): 20 min Charges:  OT General Charges $OT Visit: 1 Visit  OT Evaluation $OT Eval Moderate Complexity: 1 Mod  Barry Brunner, OT Acute Rehabilitation Services Pager [phone] Office [phone]   [name] 11/18/2019, 2:17 PM

## 2019-11-18 NOTE — Evaluation (Signed)
Physical Therapy Evaluation Patient Details Name: [name] MRN: [id number] DOB: 1929-11-12 Today's Date: 11/18/2019   History of Present Illness  86 yo female with onset of UTI was admitted with weakness and new use of RW.  Had AMS, atelectasis, AKI, metabolic acidosis on admit.  PMHx:  gastric ulcer, HLD, HTN, UTI, cardiomegaly, transfusion, kyphoplasty,   Clinical Impression  Pt was seen for mobility with RW and attempted HHA unsuccessfully for gait.  Her level of stability may be better managed with rollator, and will ask her to try this next visit to see how she controls standing balance.  Follow acutely for progression of gait and balance challenges, and work toward outpatient therapy to improve her balance.  If this looks unmanageable can convert to HHPT before dc, but for now is a good outpatient candidate.    Follow Up Recommendations Outpatient PT;Supervision for mobility/OOB    Equipment Recommendations  None recommended by PT(has rollator and RW)    Recommendations for Other Services       Precautions / Restrictions Precautions Precautions: Fall Restrictions Weight Bearing Restrictions: No      Mobility  Bed Mobility               General bed mobility comments: up in chair when PT arrived  Transfers Overall transfer level: Needs assistance Equipment used: Rolling walker (2 wheeled);1 person hand held assist Transfers: Sit to/from Stand Sit to Stand: Min assist         General transfer comment: min assist to power up and control initial balance with no AD  Ambulation/Gait Ambulation/Gait assistance: Min guard;Min assist Gait Distance (Feet): 100 Feet Assistive device: Rolling walker (2 wheeled);1 person hand held assist Gait Pattern/deviations: Step-through pattern;Wide base of support;Drifts right/left;Shuffle;Decreased stride length Gait velocity: reduced Gait velocity interpretation: <1.31 ft/sec, indicative of household ambulator General Gait  Details: pt is demonstrating a wider based gait with RW, struggling to stay inside and requires extra cues for directing in confined spaces and when sidestepping.    Stairs            Wheelchair Mobility    Modified Rankin (Stroke Patients Only)       Balance Overall balance assessment: Needs assistance Sitting-balance support: Feet supported Sitting balance-Leahy Scale: Fair     Standing balance support: Bilateral upper extremity supported;During functional activity Standing balance-Leahy Scale: Poor                               Pertinent Vitals/Pain Pain Assessment: No/denies pain    Home Living Family/patient expects to be discharged to:: Private residence Living Arrangements: Spouse/significant other Available Help at Discharge: Family Type of Home: House Home Access: Stairs to enter   Secretary/administrator of Steps: 1 Home Layout: One level Home Equipment: Environmental consultant - 2 wheels;Walker - 4 wheels Additional Comments: husband reports she has a rollator at home    Prior Function Level of Independence: Independent         Comments: no driving, completes IADLs      Hand Dominance   Dominant Hand: Right    Extremity/Trunk Assessment   Upper Extremity Assessment Upper Extremity Assessment: Defer to OT evaluation    Lower Extremity Assessment Lower Extremity Assessment: Generalized weakness    Cervical / Trunk Assessment Cervical / Trunk Assessment: Kyphotic  Communication   Communication: No difficulties  Cognition Arousal/Alertness: Awake/alert Behavior During Therapy: WFL for tasks assessed/performed Overall Cognitive Status: Impaired/Different from baseline  General Comments: requires mult redirections on how to use RW      General Comments General comments (skin integrity, edema, etc.): pt is demonstrating no obvious skin issues, but has generalized LE weakness    Exercises      Assessment/Plan    PT Assessment Patient needs continued PT services  PT Problem List Decreased strength;Decreased range of motion;Decreased activity tolerance;Decreased balance;Decreased mobility;Decreased coordination;Decreased knowledge of use of DME;Decreased safety awareness       PT Treatment Interventions DME instruction;Gait training;Functional mobility training;Therapeutic activities;Therapeutic exercise;Balance training;Neuromuscular re-education;Patient/family education;Stair training    PT Goals (Current goals can be found in the Care Plan section)  Acute Rehab PT Goals Patient Stated Goal: to go home PT Goal Formulation: With patient/family Time For Goal Achievement: 12/02/19 Potential to Achieve Goals: Good    Frequency Min 3X/week   Barriers to discharge   has steady help at home    Co-evaluation               AM-PAC PT "6 Clicks" Mobility  Outcome Measure Help needed turning from your back to your side while in a flat bed without using bedrails?: None Help needed moving from lying on your back to sitting on the side of a flat bed without using bedrails?: A Little Help needed moving to and from a bed to a chair (including a wheelchair)?: A Little Help needed standing up from a chair using your arms (e.g., wheelchair or bedside chair)?: A Little Help needed to walk in hospital room?: A Little Help needed climbing 3-5 steps with a railing? : A Lot 6 Click Score: 18    End of Session Equipment Utilized During Treatment: Gait belt Activity Tolerance: Patient tolerated treatment well;Patient limited by fatigue Patient left: in chair;with call bell/phone within reach;with chair alarm set;with family/visitor present Nurse Communication: Mobility status PT Visit Diagnosis: Unsteadiness on feet (R26.81);Muscle weakness (generalized) (M62.81)    Time: 1103-1130 PT Time Calculation (min) (ACUTE ONLY): 27 min   Charges:   PT Evaluation $PT Eval Moderate  Complexity: 1 Mod PT Treatments $Gait Training: 8-22 mins       Ramond Dial 11/18/2019, 1:44 PM   Mee Hives, PT MS Acute Rehab Dept. Number: [place]

## 2019-11-18 NOTE — ED Notes (Signed)
Report called and given to Hnghus, RN on 83M. All questions answered.

## 2019-11-18 NOTE — ED Notes (Signed)
Pt alert, resting on cart in NAD. VSS on monitors. Breathing easy, non-labored. Call light within reach. Will continue to monitor. Hourly rounds completed

## 2019-11-18 NOTE — Progress Notes (Signed)
Progress Note    [name]  WJX:[id number] DOB: 1930/06/14  DOA: 11/17/2019 PCP: Kaleen Mask, MD    Brief Narrative:     Medical records reviewed and are as summarized below:  [name] is an 86 y.o. female with medical history significant of hypertension, hyperlipidemia, gastric ulcer, history of UTI, and conditions listed below presenting to the ED via EMS for evaluation of generalized weakness.  Per family report, patient has been very weak for the past 2 days and has been using a walker which is not normal for her.  Patient is currently oriented to person and place only.  Reports having generalized weakness for the past few days.  Assessment/Plan:   Principal Problem:   UTI (urinary tract infection) Active Problems:   Hypokalemia   Generalized weakness   Acute metabolic encephalopathy   Anemia     Generalized weakness due to UTI - due to UTI and dehydration.  UA with large amount of leukocytes.  BUN and creatinine elevated compared to prior labs.  Appears dehydrated on exam.  Chest x-ray not suggestive of pneumonia. -Gentle IV fluid hydration -Management of UTI with IV abx until able to take PO -PT and OT evaluation  Mild hypokalemia -replete  Normal anion gap metabolic acidosis Possibly due to home diuretic use. Bicarb 17, anion gap 12.   -IV fluid hydration -Continue to monitor -Hold diuretic at this time  AKI -improved with IVF  Hypertension -adjust as tolerated for better control  Tobacco use -NicoDerm patch -Counseling     Family Communication/Anticipated D/C date and plan/Code Status    Code Status: Full Code.  Family Communication: Spoke with husband Disposition Plan: home in the AM if tolerating orals   Medical Consultants:    None.     Subjective:   Not hungry this AM  Objective:    Vitals:   11/18/19 0415 11/18/19 0424 11/18/19 0445 11/18/19 0927  BP:  (!) 159/72 (!) 163/88 (!) 179/70  Pulse: 74 75 77  75  Resp: 17 18 18 18   Temp:  98.6 F (37 C) 98.4 F (36.9 C) 98 F (36.7 C)  TempSrc:  Oral Oral Oral  SpO2: 95% 97% 97%   Weight:      Height:        Intake/Output Summary (Last 24 hours) at 11/18/2019 1412 Last data filed at 11/18/2019 1337 Gross per 24 hour  Intake 1536.64 ml  Output 0 ml  Net 1536.64 ml   Filed Weights   11/17/19 1002  Weight: 45.4 kg    Exam: In bed Alert and oriented x3 Regular rate and rhythm No increased work of breathing Moves all 4 extremities  Data Reviewed:   I have personally reviewed following labs and imaging studies:  Labs: Labs show the following:   Basic Metabolic Panel: Recent Labs  Lab 11/17/19 1048 11/18/19 0535  NA 140 143  K 3.1* 4.4  CL 111 110  CO2 17* 21*  GLUCOSE 108* 92  BUN 26* 25*  CREATININE 1.11* 1.07*  CALCIUM 7.8* 9.0  MG  --  2.0   GFR Estimated Creatinine Clearance: 26.5 mL/min (A) (by C-G formula based on SCr of 1.07 mg/dL (H)). Liver Function Tests: Recent Labs  Lab 11/17/19 1048  AST 22  ALT 14  ALKPHOS 46  BILITOT 0.5  PROT 6.2*  ALBUMIN 2.9*   No results for input(s): LIPASE, AMYLASE in the last 168 hours. Recent Labs  Lab 11/18/19 0535  AMMONIA 30  Coagulation profile No results for input(s): INR, PROTIME in the last 168 hours.  CBC: Recent Labs  Lab 11/17/19 1048 11/18/19 0535  WBC 7.3 7.1  NEUTROABS 5.8  --   HGB 9.5* 12.0  HCT 31.1* 37.5  MCV 98.1 93.5  PLT 169 222   Cardiac Enzymes: No results for input(s): CKTOTAL, CKMB, CKMBINDEX, TROPONINI in the last 168 hours. BNP (last 3 results) No results for input(s): PROBNP in the last 8760 hours. CBG: No results for input(s): GLUCAP in the last 168 hours. D-Dimer: No results for input(s): DDIMER in the last 72 hours. Hgb A1c: No results for input(s): HGBA1C in the last 72 hours. Lipid Profile: No results for input(s): CHOL, HDL, LDLCALC, TRIG, CHOLHDL, LDLDIRECT in the last 72 hours. Thyroid function  studies: Recent Labs    11/18/19 0535  TSH 0.781   Anemia work up: Recent Labs    11/18/19 0535  VITAMINB12 799  FOLATE 45.0  FERRITIN 426*  TIBC 220*  IRON 19*  RETICCTPCT 1.4   Sepsis Labs: Recent Labs  Lab 11/17/19 1048 11/18/19 0535  WBC 7.3 7.1    Microbiology Recent Results (from the past 240 hour(s))  SARS CORONAVIRUS 2 (TAT 6-24 HRS) Nasopharyngeal Nasopharyngeal Swab     Status: None   Collection Time: 11/17/19  8:20 PM   Specimen: Nasopharyngeal Swab  Result Value Ref Range Status   SARS Coronavirus 2 NEGATIVE NEGATIVE Final    Comment: (NOTE) SARS-CoV-2 target nucleic acids are NOT DETECTED. The SARS-CoV-2 RNA is generally detectable in upper and lower respiratory specimens during the acute phase of infection. Negative results do not preclude SARS-CoV-2 infection, do not rule out co-infections with other pathogens, and should not be used as the sole basis for treatment or other patient management decisions. Negative results must be combined with clinical observations, patient history, and epidemiological information. The expected result is Negative. Fact Sheet for Patients: HairSlick.no Fact Sheet for Healthcare Providers: quierodirigir.com This test is not yet approved or cleared by the [place] FDA and  has been authorized for detection and/or diagnosis of SARS-CoV-2 by FDA under an Emergency Use Authorization (EUA). This EUA will remain  in effect (meaning this test can be used) for the duration of the COVID-19 declaration under Section 56 4(b)(1) of the Act, 21 U.S.C. section 360bbb-3(b)(1), unless the authorization is terminated or revoked sooner. Performed at [facility], [address], [place], [place] [address]     Procedures and diagnostic studies:  DG Chest 2 View  Result Date: 11/17/2019 CLINICAL DATA:  Generalized weakness.  Hypertension. EXAM: CHEST - 2 VIEW  COMPARISON:  January 16, 2014 FINDINGS: There is slight left midlung atelectatic change. Lungs elsewhere are clear. Heart size and pulmonary vascularity are normal. No adenopathy. There is degenerative change in the thoracic spine. Bones are osteoporotic. Patient has undergone previous kyphoplasty procedure in the upper lumbar region. There is marked collapse of an upper lumbar vertebral body. IMPRESSION: No edema or consolidation. Mild atelectasis left mid lung. No adenopathy. Bones osteoporotic. Electronically Signed   By: Bretta Bang III M.D.   On: 11/17/2019 20:24   CT Head Wo Contrast  Result Date: 11/17/2019 CLINICAL DATA:  Increasing generalized weakness for 2 days. EXAM: CT HEAD WITHOUT CONTRAST TECHNIQUE: Contiguous axial images were obtained from the base of the skull through the vertex without intravenous contrast. COMPARISON:  None. FINDINGS: Brain: There is no evidence for acute hemorrhage, hydrocephalus, mass lesion, or abnormal extra-axial fluid collection. No definite  CT evidence for acute infarction. Diffuse loss of parenchymal volume is consistent with atrophy. Patchy low attenuation in the deep hemispheric and periventricular white matter is nonspecific, but likely reflects chronic microvascular ischemic demyelination. Vascular: No hyperdense vessel or unexpected calcification. Skull: No evidence for fracture. No worrisome lytic or sclerotic lesion. Sinuses/Orbits: Assessment motion degraded without gross opacification. Visualized portions of the globes and intraorbital fat are unremarkable. Other: None. IMPRESSION: 1. No acute intracranial abnormality. 2. Atrophy with chronic small vessel white matter ischemic disease. Electronically Signed   By: Misty Stanley M.D.   On: 11/17/2019 20:02    Medications:   . atenolol  100 mg Oral Daily  . famotidine  20 mg Oral QHS  . lisinopril  10 mg Oral Daily  . nicotine  7 mg Transdermal Daily  . traMADol  50 mg Oral Daily   Continuous  Infusions: . sodium chloride    . cefTRIAXone (ROCEPHIN)  IV Stopped (11/17/19 2120)  . ferumoxytol       LOS: 0 days   Geradine Girt  [facility]   How to contact the [facility] Attending or Consulting provider [place] or covering provider during after hours [place], for this patient?  1. Check the care team in [facility] and look for a) attending/consulting TRH provider listed and b) the [facility] team listed 2. Log into [email or web address] and use [place]'s universal password to access. If you do not have the password, please contact the hospital operator. 3. Locate the [facility] provider you are looking for under [facility] and page to a number that you can be directly reached. 4. If you still have difficulty reaching the provider, please page the [facility] (Director on Call) for the Hospitalists listed on amion for assistance.  11/18/2019, 2:12 PM

## 2019-11-18 NOTE — ED Notes (Signed)
Assumed care of pt to ED rm. 8. Pt alert, speaking in full sentences. Breathing easy, non-labored. Placed on continuous monitors. Equal rise and fall of chest noted. Bed in lowest position, locked, side rails upx2. Call light within reach

## 2019-11-18 NOTE — ED Notes (Addendum)
Pt had large episode of urinary incontinence. Pt turned, cleaned and repositioned. Skin kept clean and dry. Pt placed on purwick.

## 2019-11-19 LAB — BASIC METABOLIC PANEL
Anion gap: 8 (ref 5–15)
BUN: 20 mg/dL (ref 8–23)
CO2: 20 mmol/L — ABNORMAL LOW (ref 22–32)
Calcium: 8.4 mg/dL — ABNORMAL LOW (ref 8.9–10.3)
Chloride: 111 mmol/L (ref 98–111)
Creatinine, Ser: 0.86 mg/dL (ref 0.44–1.00)
GFR calc Af Amer: >60 mL/min (ref >60)
GFR calc non Af Amer: >60 mL/min (ref >60)
Glucose, Bld: 129 mg/dL — ABNORMAL HIGH (ref 70–99)
Potassium: 3.3 mmol/L — ABNORMAL LOW (ref 3.5–5.1)
Sodium: 139 mmol/L (ref 135–145)

## 2019-11-19 MED ORDER — CEFUROXIME AXETIL 250 MG PO TABS: 250.0000 mg | ORAL_TABLET | Freq: Two times a day (BID) | ORAL | 0 refills | Status: AC

## 2019-11-19 MED ORDER — PROBIOTIC ACIDOPHILUS PO TABS: 1.0000 | ORAL_TABLET | Freq: Two times a day (BID) | ORAL | 0 refills | Status: DC

## 2019-11-19 NOTE — TOC Progression Note (Signed)
Transition of Care [facility]) - Progression Note    Patient Details  Name: [name] MRN: [id number] Date of Birth: [birth year]  Transition of Care [facility]) CM/SW Contact  Bess Kinds, RN Phone Number: [phone] 11/19/2019, 1:37 PM  Clinical Narrative:    [facility] accepted referral for PT and OT. Services will begin on Monday. Patient and spouse aware and agreeable to a Monday start of care. Patient will need HH orders for PT and OT with Face to Face at discharge. TOC following for transition needs.    Expected Discharge Plan: Home w Home Health Services Barriers to Discharge: Continued Medical Work up  Expected Discharge Plan and Services Expected Discharge Plan: Home w Home Health Services In-house Referral: NA Discharge Planning Services: CM Consult Post Acute Care Choice: Home Health Living arrangements for the past 2 months: Single Family Home                 DME Arranged: N/A DME Agency: NA       HH Arranged: PT, OT           Social Determinants of Health (SDOH) Interventions    Readmission Risk Interventions No flowsheet data found.

## 2019-11-19 NOTE — TOC Initial Note (Signed)
Transition of Care [facility]) - Initial/Assessment Note    Patient Details  Name: [name] MRN: [id number] Date of Birth: 07-Mar-1930  Transition of Care [facility]) CM/SW Contact:    Bess Kinds, RN Phone Number: [phone] 11/19/2019, 12:03 PM  Clinical Narrative:                 Spoke with patient and spouse at bedside. PTA home with spouse. Has DME - RW, rollator, "potty chair." PCP - Dr. Shelah Lewandowsky. Will be following up with PCP next week for tramadol refill. [facility] which will deliver if needed, but usually spouse picks up medications. Discussed recommendations for [facility] PT and OT - patient and spouse agreeable. Offered choice of home health agencies. Referral pending with [facility]. Spouse to provide transport home at discharge. TOC following.   Expected Discharge Plan: Home w Home Health Services Barriers to Discharge: Continued Medical Work up   Patient Goals and CMS Choice Patient states their goals for this hospitalization and ongoing recovery are:: return home with spouse CMS Medicare.gov Compare Post Acute Care list provided to:: Patient Choice offered to / list presented to : Patient, Spouse  Expected Discharge Plan and Services Expected Discharge Plan: Home w Home Health Services In-house Referral: NA Discharge Planning Services: CM Consult Post Acute Care Choice: Home Health Living arrangements for the past 2 months: Single Family Home                 DME Arranged: N/A DME Agency: NA       HH Arranged: PT, OT          Prior Living Arrangements/Services Living arrangements for the past 2 months: Single Family Home Lives with:: Self, Spouse Patient language and need for interpreter reviewed:: Yes Do you feel safe going back to the place where you live?: Yes      Need for Family Participation in Patient Care: Yes (Comment) Care giver support system in place?: Yes (comment) Current home services: DME Criminal Activity/Legal Involvement Pertinent  to Current Situation/Hospitalization: No - Comment as needed  Activities of Daily Living Home Assistive Devices/Equipment: None ADL Screening (condition at time of admission) Patient's cognitive ability adequate to safely complete daily activities?: No Is the patient deaf or have difficulty hearing?: No Does the patient have difficulty seeing, even when wearing glasses/contacts?: No Does the patient have difficulty concentrating, remembering, or making decisions?: Yes Patient able to express need for assistance with ADLs?: Yes Does the patient have difficulty dressing or bathing?: Yes Independently performs ADLs?: No Communication: Needs assistance Is this a change from baseline?: Pre-admission baseline Dressing (OT): Needs assistance Is this a change from baseline?: Pre-admission baseline Grooming: Needs assistance Is this a change from baseline?: Pre-admission baseline Feeding: Needs assistance Is this a change from baseline?: Pre-admission baseline Bathing: Needs assistance Is this a change from baseline?: Pre-admission baseline Toileting: Needs assistance Is this a change from baseline?: Pre-admission baseline In/Out Bed: Needs assistance Is this a change from baseline?: Pre-admission baseline Walks in Home: Needs assistance Is this a change from baseline?: Pre-admission baseline Does the patient have difficulty walking or climbing stairs?: Yes Weakness of Legs: Both Weakness of Arms/Hands: Both  Permission Sought/Granted Permission sought to share information with : Family Supports          Permission granted to share info w Relationship: spouse     Emotional Assessment Appearance:: Appears stated age Attitude/Demeanor/Rapport: Engaged Affect (typically observed): Accepting Orientation: : Oriented to Self, Oriented to  Time, Oriented to Place, Oriented to Situation Alcohol / Substance Use: Not Applicable Psych Involvement: No (comment)  Admission diagnosis:   Generalized weakness [R53.1] UTI (urinary tract infection) [N39.0] Patient Active Problem List   Diagnosis Date Noted  . Generalized weakness 11/17/2019  . UTI (urinary tract infection) 11/17/2019  . Acute metabolic encephalopathy 75/88/3254  . Anemia 11/17/2019  . Dehydration 01/16/2014  . Nausea and vomiting 01/16/2014  . Elevated lactic acid level 01/16/2014  . AKI (acute kidney injury) ([place]) 01/16/2014  . L1 vertebral fracture ([place]) 01/13/2014  . Protein-calorie malnutrition, severe ([place]) 03/24/2013  . Hypokalemia 02/17/2013  . Hypertension 02/17/2013  . Hyperlipemia 02/17/2013   PCP:  Leonard Downing, MD Pharmacy[facility]   [id number] [place], [place] [address] Phone: [phone] Fax: [phone]     Social Determinants of Health (SDOH) Interventions    Readmission Risk Interventions No flowsheet data found.

## 2019-11-19 NOTE — Discharge Summary (Addendum)
Physician Discharge Summary  Baker Kogler BLT:[id number] DOB: [birth year] DOA: 11/17/2019  PCP: Kaleen Mask, MD  Admit date: 11/17/2019 Discharge date: 11/19/2019  Admitted From: Home Disposition:  Home with [facility] PT OT  Recommendations for Outpatient Follow-up:  1. Follow up with PCP in 1-2 weeks 2. Please obtain BMP/CBC in one week   Home Health:Yes,PT  And OT Equipment/Devices:No  Discharge Condition:Stable CODE STATUS: Full Diet recommendation: Regular  Brief/Interim Summary: Dannika Hilgeman is an 86 y.o. female with medical history significant ofhypertension, hyperlipidemia, gastric ulcer, history of UTI, and conditions listed below presenting to the ED via EMS for evaluation of generalized weakness. Per family report, patient has been very weak for the past 2 days and has been using a walker which is not normal for her.Patient is currently oriented to person and place only. Reports having generalized weakness for the past few days.  Discharge Diagnoses:  Principal Problem:   UTI (urinary tract infection) Active Problems:   Hypokalemia   Generalized weakness   Acute metabolic encephalopathy   Anemia  Generalized weakness due to UTI - due to UTI and dehydration. UA with large amount of leukocytes. BUN and creatinine elevated compared to prior labs. Appeared dehydrated on exam. Chest x-ray not suggestive of pneumonia. She was treated with IV fluids.Completed treatment with Rocephin, will start treatment with cefuroxime on discharge. Tolerating intake of foods and liquids  For hydration well prior to discharge. She will have physical therapy and OT at home.  Mild hypokalemia Resolved.   Normal anion gap metabolic acidosis Possibly due to home diuretic use.Bicarb 17, anion gap 12. -Hold diuretic at this time  AKI -Resolved with IVF  Hypertension -BP improved.  Tobacco use Patient counseled to quit smoke.   Discharge Instructions  Discharge  Instructions    Diet - low sodium heart healthy   Complete by: As directed    Increase activity slowly   Complete by: As directed      Allergies as of 11/19/2019   No Known Allergies     Medication List    TAKE these medications   acetaminophen 500 MG tablet Commonly known as: TYLENOL Take 500 mg by mouth See admin instructions. Take 500 mg by mouth in the morning and an additional 500 mg once a day as needed for pain   alendronate 70 MG tablet Commonly known as: FOSAMAX Take 70 mg by mouth every Wednesday.   ascorbic acid 500 MG tablet Commonly known as: VITAMIN C Take 500-1,000 mg by mouth at bedtime.   atenolol 100 MG tablet Commonly known as: TENORMIN Take 100 mg by mouth daily.   Calcium 600+D3 600-800 MG-UNIT Tabs Generic drug: Calcium Carb-Cholecalciferol Take 2 tablets by mouth every evening.   cefUROXime 250 MG tablet Commonly known as: CEFTIN Take 1 tablet (250 mg total) by mouth 2 (two) times daily with a meal for 5 days.   docusate sodium 100 MG capsule Commonly known as: COLACE Take 100 mg by mouth at bedtime.   lisinopril 10 MG tablet Commonly known as: ZESTRIL Take 10 mg by mouth daily.   ondansetron 4 MG tablet Commonly known as: Zofran Take 1 tablet (4 mg total) by mouth every 8 (eight) hours as needed for nausea or vomiting.   pantoprazole 40 MG tablet Commonly known as: PROTONIX Take 1 tablet (40 mg total) by mouth daily.   Pepcid AC Maximum Strength 20 MG Chew Generic drug: Famotidine Chew 20 mg by mouth at bedtime.   Probiotic Acidophilus Tabs Take  1 tablet by mouth 2 (two) times daily.   promethazine 25 MG tablet Commonly known as: PHENERGAN Take 25 mg by mouth every 6 (six) hours as needed for nausea.   SUPER B COMPLEX PO Take 1 tablet by mouth daily with breakfast.   traMADol 50 MG tablet Commonly known as: ULTRAM Take 50 mg by mouth See admin instructions. Take 50 mg by mouth in the morning and an additional 50 mg once a  day as needed for pain   triamterene-hydrochlorothiazide 75-50 MG tablet Commonly known as: MAXZIDE Take 0.5 tablets by mouth daily.   VITAMIN A PO Take 1 capsule by mouth daily with breakfast.   Vitamin D-3 25 MCG (1000 UT) Caps Take 1,000 Units by mouth at bedtime.      Follow-up Information    [facility].   Specialty: Home Health Services Why: The office will call to schedule visits for physical therapy and occupational therapy. Services to begin on Monday.  Contact information: [address] [place] [address] [phone]        Leonard Downing, MD Follow up in 1 week(s).   Specialty: Family Medicine Contact information: [place] [address] [phone]          No Known Allergies  Consultations:  None   Procedures/Studies: DG Chest 2 View  Result Date: 11/17/2019 CLINICAL DATA:  Generalized weakness.  Hypertension. EXAM: CHEST - 2 VIEW COMPARISON:  January 16, 2014 FINDINGS: There is slight left midlung atelectatic change. Lungs elsewhere are clear. Heart size and pulmonary vascularity are normal. No adenopathy. There is degenerative change in the thoracic spine. Bones are osteoporotic. Patient has undergone previous kyphoplasty procedure in the upper lumbar region. There is marked collapse of an upper lumbar vertebral body. IMPRESSION: No edema or consolidation. Mild atelectasis left mid lung. No adenopathy. Bones osteoporotic. Electronically Signed   By: Lowella Grip III M.D.   On: 11/17/2019 20:24   CT Head Wo Contrast  Result Date: 11/17/2019 CLINICAL DATA:  Increasing generalized weakness for 2 days. EXAM: CT HEAD WITHOUT CONTRAST TECHNIQUE: Contiguous axial images were obtained from the base of the skull through the vertex without intravenous contrast. COMPARISON:  None. FINDINGS: Brain: There is no evidence for acute hemorrhage, hydrocephalus, mass lesion, or abnormal extra-axial fluid  collection. No definite CT evidence for acute infarction. Diffuse loss of parenchymal volume is consistent with atrophy. Patchy low attenuation in the deep hemispheric and periventricular white matter is nonspecific, but likely reflects chronic microvascular ischemic demyelination. Vascular: No hyperdense vessel or unexpected calcification. Skull: No evidence for fracture. No worrisome lytic or sclerotic lesion. Sinuses/Orbits: Assessment motion degraded without gross opacification. Visualized portions of the globes and intraorbital fat are unremarkable. Other: None. IMPRESSION: 1. No acute intracranial abnormality. 2. Atrophy with chronic small vessel white matter ischemic disease. Electronically Signed   By: Misty Stanley M.D.   On: 11/17/2019 20:02      Subjective: Patient reports feeling better and that she is ready to go home.  Discharge Exam: Vitals:   11/19/19 0533 11/19/19 0855  BP: (!) 140/57 (!) 142/59  Pulse: 66 75  Resp: 16 18  Temp: 97.6 F (36.4 C) 98 F (36.7 C)  SpO2: 100% 98%   Vitals:   11/18/19 2032 11/18/19 2159 11/19/19 0533 11/19/19 0855  BP: (!) 183/67 (!) 175/66 (!) 140/57 (!) 142/59  Pulse: 69 72 66 75  Resp: 16  16 18   Temp: 97.9 F (  36.6 C)  97.6 F (36.4 C) 98 F (36.7 C)  TempSrc:    Oral  SpO2: 97% 96% 100% 98%  Weight:      Height:        General: Pt is alert, awake, not in acute distress Cardiovascular: RRR, S1/S2 present Respiratory: No respiratory distress, no wheezing, no rhonchi Abdominal: Soft, NT, ND Extremities: no edema, no cyanosis    The results of significant diagnostics from this hospitalization (including imaging, microbiology, ancillary and laboratory) are listed below for reference.     Microbiology: Recent Results (from the past 240 hour(s))  SARS CORONAVIRUS 2 (TAT 6-24 HRS) Nasopharyngeal Nasopharyngeal Swab     Status: None   Collection Time: 11/17/19  8:20 PM   Specimen: Nasopharyngeal Swab  Result Value Ref Range  Status   SARS Coronavirus 2 NEGATIVE NEGATIVE Final    Comment: (NOTE) SARS-CoV-2 target nucleic acids are NOT DETECTED. The SARS-CoV-2 RNA is generally detectable in upper and lower respiratory specimens during the acute phase of infection. Negative results do not preclude SARS-CoV-2 infection, do not rule out co-infections with other pathogens, and should not be used as the sole basis for treatment or other patient management decisions. Negative results must be combined with clinical observations, patient history, and epidemiological information. The expected result is Negative. Fact Sheet for Patients: HairSlick.no Fact Sheet for Healthcare Providers: quierodirigir.com This test is not yet approved or cleared by the [place] FDA and  has been authorized for detection and/or diagnosis of SARS-CoV-2 by FDA under an Emergency Use Authorization (EUA). This EUA will remain  in effect (meaning this test can be used) for the duration of the COVID-19 declaration under Section 56 4(b)(1) of the Act, 21 U.S.C. section 360bbb-3(b)(1), unless the authorization is terminated or revoked sooner. Performed at [facility], [address], [place], [place] [address]      Labs: BNP (last 3 results) No results for input(s): BNP in the last 8760 hours. Basic Metabolic Panel: Recent Labs  Lab 11/17/19 1048 11/18/19 0535 11/19/19 1008  NA 140 143 139  K 3.1* 4.4 3.3*  CL 111 110 111  CO2 17* 21* 20*  GLUCOSE 108* 92 129*  BUN 26* 25* 20  CREATININE 1.11* 1.07* 0.86  CALCIUM 7.8* 9.0 8.4*  MG  --  2.0  --    Liver Function Tests: Recent Labs  Lab 11/17/19 1048  AST 22  ALT 14  ALKPHOS 46  BILITOT 0.5  PROT 6.2*  ALBUMIN 2.9*   No results for input(s): LIPASE, AMYLASE in the last 168 hours. Recent Labs  Lab 11/18/19 0535  AMMONIA 30   CBC: Recent Labs  Lab 11/17/19 1048 11/18/19 0535  WBC 7.3 7.1   NEUTROABS 5.8  --   HGB 9.5* 12.0  HCT 31.1* 37.5  MCV 98.1 93.5  PLT 169 222   Cardiac Enzymes: No results for input(s): CKTOTAL, CKMB, CKMBINDEX, TROPONINI in the last 168 hours. BNP: Invalid input(s): POCBNP CBG: No results for input(s): GLUCAP in the last 168 hours. D-Dimer No results for input(s): DDIMER in the last 72 hours. Hgb A1c No results for input(s): HGBA1C in the last 72 hours. Lipid Profile No results for input(s): CHOL, HDL, LDLCALC, TRIG, CHOLHDL, LDLDIRECT in the last 72 hours. Thyroid function studies Recent Labs    11/18/19 0535  TSH 0.781   Anemia work up Recent Labs    11/18/19 0535  VITAMINB12 799  FOLATE 45.0  FERRITIN 426*  TIBC 220*  IRON 19*  RETICCTPCT 1.4   Urinalysis    Component Value Date/Time   COLORURINE YELLOW 11/17/2019 1500   APPEARANCEUR CLOUDY (A) 11/17/2019 1500   LABSPEC 1.012 11/17/2019 1500   PHURINE 9.0 (H) 11/17/2019 1500   GLUCOSEU NEGATIVE 11/17/2019 1500   HGBUR NEGATIVE 11/17/2019 1500   BILIRUBINUR NEGATIVE 11/17/2019 1500   KETONESUR 5 (A) 11/17/2019 1500   PROTEINUR >=300 (A) 11/17/2019 1500   UROBILINOGEN 0.2 03/22/2013 0557   NITRITE NEGATIVE 11/17/2019 1500   LEUKOCYTESUR LARGE (A) 11/17/2019 1500   Sepsis Labs Invalid input(s): PROCALCITONIN,  WBC,  LACTICIDVEN Microbiology Recent Results (from the past 240 hour(s))  SARS CORONAVIRUS 2 (TAT 6-24 HRS) Nasopharyngeal Nasopharyngeal Swab     Status: None   Collection Time: 11/17/19  8:20 PM   Specimen: Nasopharyngeal Swab  Result Value Ref Range Status   SARS Coronavirus 2 NEGATIVE NEGATIVE Final    Comment: (NOTE) SARS-CoV-2 target nucleic acids are NOT DETECTED. The SARS-CoV-2 RNA is generally detectable in upper and lower respiratory specimens during the acute phase of infection. Negative results do not preclude SARS-CoV-2 infection, do not rule out co-infections with other pathogens, and should not be used as the sole basis for treatment or  other patient management decisions. Negative results must be combined with clinical observations, patient history, and epidemiological information. The expected result is Negative. Fact Sheet for Patients: HairSlick.no Fact Sheet for Healthcare Providers: quierodirigir.com This test is not yet approved or cleared by the [place] FDA and  has been authorized for detection and/or diagnosis of SARS-CoV-2 by FDA under an Emergency Use Authorization (EUA). This EUA will remain  in effect (meaning this test can be used) for the duration of the COVID-19 declaration under Section 56 4(b)(1) of the Act, 21 U.S.C. section 360bbb-3(b)(1), unless the authorization is terminated or revoked sooner. Performed at [facility], [address], [place], [place] [address]      Time coordinating discharge: Over 33 minutes  SIGNED:   Ky Barban, MD  [facility] 11/19/2019, 2:29 PM  Discussed with her husband  At the bedside

## 2019-11-19 NOTE — Progress Notes (Signed)
Physical Therapy Treatment Patient Details Name: [name] MRN: [id number] DOB: 05-02-1930 Today's Date: 11/19/2019    History of Present Illness Pt is a 86 y/o female with PMH of HTN, gastric ulcer, UTI, presenting to ED for evaluation of generalized weakness x 2 days. Found with UTI and dehydration.     PT Comments    Patient received in recliner, finishing OT session. Patient agrees to PT session. Slightly impulsive with mobility, requires cues to wait for me to get IV pole unplugged. Transfers with supervision, patient ambulated holding to IV pole x 200 feet. Improved strength and ability this day, however continues to rely on UE support for balance. Patient will continue to benefit from skilled PT while here to improve balance, strength and activity tolerance for return home.      Follow Up Recommendations  Outpatient PT;Supervision for mobility/OOB;Home health PT(if unable to drive or get transport, HHPT)     Equipment Recommendations  None recommended by PT    Recommendations for Other Services       Precautions / Restrictions Precautions Precautions: Fall Restrictions Weight Bearing Restrictions: No    Mobility  Bed Mobility Overal bed mobility: Needs Assistance Bed Mobility: Supine to Sit     Supine to sit: Supervision     General bed mobility comments: not assessed, patient up in recliner  Transfers Overall transfer level: Needs assistance Equipment used: 1 person hand held assist Transfers: Sit to/from Stand Sit to Stand: Min guard         General transfer comment: cues for safety, balance, cueing for hand placement  Ambulation/Gait Ambulation/Gait assistance: Min guard Gait Distance (Feet): 200 Feet Assistive device: IV Pole Gait Pattern/deviations: Step-through pattern;Decreased stride length Gait velocity: reduced   General Gait Details: patient with increased tolerance this day. Seems reliant on UE support. Offered to push IV pole for her, but  she wanted to hold to it.   Stairs             Wheelchair Mobility    Modified Rankin (Stroke Patients Only)       Balance Overall balance assessment: Needs assistance Sitting-balance support: Feet supported Sitting balance-Leahy Scale: Good     Standing balance support: Single extremity supported;During functional activity Standing balance-Leahy Scale: Fair Standing balance comment: reliant on single UE support  (occasional B UE support) with ambulation. As strength increases she should not require UE support                            Cognition Arousal/Alertness: Awake/alert Behavior During Therapy: WFL for tasks assessed/performed Overall Cognitive Status: Impaired/Different from baseline Area of Impairment: Attention;Memory;Awareness;Problem solving;Following commands;Safety/judgement;Orientation                 Orientation Level: Disoriented to;Time;Situation Current Attention Level: Sustained Memory: Decreased short-term memory Following Commands: Follows one step commands consistently;Follows one step commands with increased time;Follows multi-step commands inconsistently Safety/Judgement: Decreased awareness of safety;Decreased awareness of deficits Awareness: Emergent Problem Solving: Slow processing;Requires verbal cues;Difficulty sequencing General Comments: patient continues to present with disorientation to time (as well as situation today), poor STM, impaired sequencing and safety awareness (attempted parts of short blessed test, but patient unable to complete)      Exercises      General Comments        Pertinent Vitals/Pain Pain Assessment: No/denies pain    Home Living Family/patient expects to be discharged to:: Private residence Living Arrangements: Spouse/significant other  Prior Function            PT Goals (current goals can now be found in the care plan section) Acute Rehab PT  Goals Patient Stated Goal: to go home PT Goal Formulation: With patient/family Time For Goal Achievement: 12/02/19 Potential to Achieve Goals: Good Progress towards PT goals: Progressing toward goals    Frequency    Min 3X/week      PT Plan Current plan remains appropriate    Co-evaluation              AM-PAC PT "6 Clicks" Mobility   Outcome Measure  Help needed turning from your back to your side while in a flat bed without using bedrails?: None Help needed moving from lying on your back to sitting on the side of a flat bed without using bedrails?: A Little Help needed moving to and from a bed to a chair (including a wheelchair)?: A Little Help needed standing up from a chair using your arms (e.g., wheelchair or bedside chair)?: A Little Help needed to walk in hospital room?: A Little Help needed climbing 3-5 steps with a railing? : A Little 6 Click Score: 19    End of Session Equipment Utilized During Treatment: Gait belt Activity Tolerance: Patient tolerated treatment well Patient left: in chair;with call bell/phone within reach;with chair alarm set;with family/visitor present Nurse Communication: Mobility status PT Visit Diagnosis: Unsteadiness on feet (R26.81);Muscle weakness (generalized) (M62.81)     Time: 9449-6759 PT Time Calculation (min) (ACUTE ONLY): 10 min  Charges:  $Gait Training: 8-22 mins                     [employer], PT, [facility] 11/19/19,10:02 AM

## 2019-11-19 NOTE — TOC Transition Note (Signed)
Transition of Care [facility]) - CM/SW Discharge Note   Patient Details  Name: [name] MRN: [id number] Date of Birth: [birth year]  Transition of Care [facility]) CM/SW Contact:  Bess Kinds, RN Phone Number: [phone] 11/19/2019, 2:31 PM   Clinical Narrative:    Patient to transition home today. Spouse to provide transportation home. HH orders placed by MD. [facility] made aware. Start of care planned for Monday. No further TOC needs identified at this time.    Final next level of care: Home w Home Health Services Barriers to Discharge: No Barriers Identified   Patient Goals and CMS Choice Patient states their goals for this hospitalization and ongoing recovery are:: return home with spouse CMS Medicare.gov Compare Post Acute Care list provided to:: Patient Choice offered to / list presented to : Patient, Spouse  Discharge Placement                       Discharge Plan and Services In-house Referral: NA Discharge Planning Services: CM Consult Post Acute Care Choice: Home Health          DME Arranged: N/A DME Agency: NA       HH Arranged: PT, OT HH [facility]: [facility] Date HH Agency Contacted: 11/19/19 Time HH Agency Contacted: 1431 Representative spoke with at [facility] Agency: Tiffany  Social Determinants of Health (SDOH) Interventions     Readmission Risk Interventions No flowsheet data found.

## 2019-11-19 NOTE — Plan of Care (Signed)
  Problem: Education: Goal: Knowledge of General Education information will improve Description: Including pain rating scale, medication(s)/side effects and non-pharmacologic comfort measures Outcome: Progressing   Problem: Safety: Goal: Ability to remain free from injury will improve Outcome: Progressing   

## 2019-11-19 NOTE — Progress Notes (Signed)
Occupational Therapy Treatment Patient Details Name: [name] MRN: [id number] DOB: 05/29/1930 Today's Date: 11/19/2019    History of present illness Pt is a 86 y/o female with PMH of HTN, gastric ulcer, UTI, presenting to ED for evaluation of generalized weakness x 2 days. Found with UTI and dehydration.    OT comments  Patient progressing slowly towards OT goals.  Continues to be limited by cognition, balance and weakness.  Unable to formally test cognition, but limited recall of hand placement and safety with RW mgmt, as well as no recall of address provided after 2 minutes.  Patient currently requires min guard transfers, in room mobility (with max cueing for walker mgmt) and grooming at sink. Will follow acutely. Continue to recommend 24/7 assistance at dc.     Follow Up Recommendations  Home health OT;Supervision/Assistance - 24 hour    Equipment Recommendations  3 in 1 bedside commode    Recommendations for Other Services      Precautions / Restrictions Precautions Precautions: Fall Restrictions Weight Bearing Restrictions: No       Mobility Bed Mobility Overal bed mobility: Needs Assistance Bed Mobility: Supine to Sit     Supine to sit: Supervision     General bed mobility comments: increased time and HOB elevated, min cueing but no physical assist required  Transfers Overall transfer level: Needs assistance Equipment used: Rolling walker (2 wheeled);1 person hand held assist Transfers: Sit to/from Stand Sit to Stand: Min guard         General transfer comment: for safety, balance, cueing for hand placement     Balance Overall balance assessment: Needs assistance Sitting-balance support: No upper extremity supported;Feet supported Sitting balance-Leahy Scale: Fair     Standing balance support: Bilateral upper extremity supported;No upper extremity supported;During functional activity Standing balance-Leahy Scale: Poor Standing balance comment: relaint  on BUE support dynamically but able to groom at sink with min guard and 0 hand support                           ADL either performed or assessed with clinical judgement   ADL Overall ADL's : Needs assistance/impaired     Grooming: Min guard;Standing;Wash/dry Geophysical data processor Transfer: Min guard;Ambulation;RW Toilet Transfer Details (indicate cue type and reason): to 3:1 commode in bathroom          Functional mobility during ADLs: Min guard;Minimal assistance;Rolling walker;Cueing for safety;Cueing for sequencing General ADL Comments: cueing throughout session for hand placement during transfers, RW mgmt, and safety; patient highly distractable and requires constant cueing, poor STM; question if she would do better without RW (as this is new to her)      Vision       Perception     Praxis      Cognition Arousal/Alertness: Awake/alert Behavior During Therapy: WFL for tasks assessed/performed Overall Cognitive Status: Impaired/Different from baseline Area of Impairment: Attention;Memory;Awareness;Problem solving;Following commands;Safety/judgement;Orientation                 Orientation Level: Disoriented to;Time;Situation Current Attention Level: Sustained Memory: Decreased short-term memory Following Commands: Follows one step commands consistently;Follows one step commands with increased time;Follows multi-step commands inconsistently Safety/Judgement: Decreased awareness of safety;Decreased awareness of deficits Awareness: Emergent Problem Solving: Slow processing;Requires verbal cues;Difficulty sequencing General Comments: patient continues to present with disorientation to time (as well as situation today), poor STM,  impaired sequencing and safety awareness (attempted parts of short blessed test, but patient unable to complete)        Exercises     Shoulder Instructions       General Comments      Pertinent Vitals/  Pain       Pain Assessment: No/denies pain  Home Living Family/patient expects to be discharged to:: Private residence Living Arrangements: Spouse/significant other                                      Prior Functioning/Environment              Frequency  Min 2X/week        Progress Toward Goals  OT Goals(current goals can now be found in the care plan section)  Progress towards OT goals: Progressing toward goals  Acute Rehab OT Goals Patient Stated Goal: to go home OT Goal Formulation: With patient  Plan Discharge plan remains appropriate;Frequency remains appropriate    Co-evaluation                 AM-PAC OT "6 Clicks" Daily Activity     Outcome Measure   Help from another person eating meals?: None Help from another person taking care of personal grooming?: A Little Help from another person toileting, which includes using toliet, bedpan, or urinal?: A Little Help from another person bathing (including washing, rinsing, drying)?: A Lot Help from another person to put on and taking off regular upper body clothing?: None Help from another person to put on and taking off regular lower body clothing?: A Little 6 Click Score: 19    End of Session Equipment Utilized During Treatment: Rolling walker  OT Visit Diagnosis: Other abnormalities of gait and mobility (R26.89);Muscle weakness (generalized) (M62.81);Other symptoms and signs involving cognitive function   Activity Tolerance Patient tolerated treatment well   Patient Left in chair;with call bell/phone within reach;Other (comment)(handoff to PT )   Nurse Communication Mobility status        Time: [phone] OT Time Calculation (min): 20 min  Charges: OT General Charges $OT Visit: 1 Visit OT Treatments $Self Care/Home Management : 8-22 mins  Barry Brunner, OT Acute Rehabilitation Services Pager [phone] Office [phone]    Chancy Milroy 11/19/2019, 9:49 AM

## 2020-01-24 ENCOUNTER — Emergency Department (HOSPITAL_COMMUNITY)
Admission: EM | Admit: 2020-01-24 | Discharge: 2020-01-24 | Disposition: A | Discharge status: Home / Self Care | Payer: Medicare Other | Attending: Emergency Medicine | Admitting: Emergency Medicine

## 2020-01-24 ENCOUNTER — Emergency Department (HOSPITAL_COMMUNITY): Payer: Medicare Other

## 2020-01-24 ENCOUNTER — Other Ambulatory Visit: Payer: Self-pay

## 2020-01-24 DIAGNOSIS — R946 Abnormal results of thyroid function studies: Secondary | ICD-10-CM | POA: Diagnosis not present

## 2020-01-24 DIAGNOSIS — F17210 Nicotine dependence, cigarettes, uncomplicated: Secondary | ICD-10-CM | POA: Insufficient documentation

## 2020-01-24 DIAGNOSIS — Z79899 Other long term (current) drug therapy: Secondary | ICD-10-CM | POA: Insufficient documentation

## 2020-01-24 DIAGNOSIS — N814 Uterovaginal prolapse, unspecified: Secondary | ICD-10-CM | POA: Insufficient documentation

## 2020-01-24 DIAGNOSIS — I10 Essential (primary) hypertension: Secondary | ICD-10-CM | POA: Insufficient documentation

## 2020-01-24 DIAGNOSIS — R7989 Other specified abnormal findings of blood chemistry: Secondary | ICD-10-CM

## 2020-01-24 DIAGNOSIS — R509 Fever, unspecified: Secondary | ICD-10-CM | POA: Insufficient documentation

## 2020-01-24 DIAGNOSIS — R531 Weakness: Secondary | ICD-10-CM

## 2020-01-24 DIAGNOSIS — Z20822 Contact with and (suspected) exposure to covid-19: Secondary | ICD-10-CM | POA: Insufficient documentation

## 2020-01-24 LAB — CBC WITH DIFFERENTIAL/PLATELET
Abs Immature Granulocytes: 0.03 10*3/uL (ref 0.00–0.07)
Basophils Absolute: 0 10*3/uL (ref 0.0–0.1)
Basophils Relative: 0 %
Eosinophils Absolute: 0.3 10*3/uL (ref 0.0–0.5)
Eosinophils Relative: 4 %
HCT: 35.6 % — ABNORMAL LOW (ref 36.0–46.0)
Hemoglobin: 11.6 g/dL — ABNORMAL LOW (ref 12.0–15.0)
Immature Granulocytes: 0 %
Lymphocytes Relative: 6 %
Lymphs Abs: 0.5 10*3/uL — ABNORMAL LOW (ref 0.7–4.0)
MCH: 30.3 pg (ref 26.0–34.0)
MCHC: 32.6 g/dL (ref 30.0–36.0)
MCV: 93 fL (ref 80.0–100.0)
Monocytes Absolute: 0.4 10*3/uL (ref 0.1–1.0)
Monocytes Relative: 5 %
Neutro Abs: 7.1 10*3/uL (ref 1.7–7.7)
Neutrophils Relative %: 85 %
Platelets: 178 10*3/uL (ref 150–400)
RBC: 3.83 MIL/uL — ABNORMAL LOW (ref 3.87–5.11)
RDW: 13.6 % (ref 11.5–15.5)
WBC: 8.4 10*3/uL (ref 4.0–10.5)
nRBC: 0 % (ref 0.0–0.2)

## 2020-01-24 LAB — URINALYSIS, ROUTINE W REFLEX MICROSCOPIC
Bilirubin Urine: NEGATIVE
Glucose, UA: NEGATIVE mg/dL
Hgb urine dipstick: NEGATIVE
Ketones, ur: 5 mg/dL — AB
Leukocytes,Ua: NEGATIVE
Nitrite: NEGATIVE
Protein, ur: NEGATIVE mg/dL
Specific Gravity, Urine: 1.013 (ref 1.005–1.030)
pH: 6 (ref 5.0–8.0)

## 2020-01-24 LAB — BASIC METABOLIC PANEL
Anion gap: 12 (ref 5–15)
BUN: 19 mg/dL (ref 8–23)
CO2: 24 mmol/L (ref 22–32)
Calcium: 9.1 mg/dL (ref 8.9–10.3)
Chloride: 100 mmol/L (ref 98–111)
Creatinine, Ser: 0.82 mg/dL (ref 0.44–1.00)
GFR calc Af Amer: >60 mL/min (ref >60)
GFR calc non Af Amer: >60 mL/min (ref >60)
Glucose, Bld: 119 mg/dL — ABNORMAL HIGH (ref 70–99)
Potassium: 3.9 mmol/L (ref 3.5–5.1)
Sodium: 136 mmol/L (ref 135–145)

## 2020-01-24 LAB — POC SARS CORONAVIRUS 2 AG -  ED: SARS Coronavirus 2 Ag: NEGATIVE

## 2020-01-24 LAB — MAGNESIUM: Magnesium: 2 mg/dL (ref 1.7–2.4)

## 2020-01-24 LAB — BRAIN NATRIURETIC PEPTIDE: B Natriuretic Peptide: 111.7 pg/mL — ABNORMAL HIGH (ref 0.0–100.0)

## 2020-01-24 LAB — T4, FREE: Free T4: 1.22 ng/dL — ABNORMAL HIGH (ref 0.61–1.12)

## 2020-01-24 LAB — TSH: TSH: 0.238 u[IU]/mL — ABNORMAL LOW (ref 0.350–4.500)

## 2020-01-24 MED ORDER — SODIUM CHLORIDE 0.9 % IV BOLUS
1000.0000 mL | Freq: Once | INTRAVENOUS | Status: AC
  Administered 2020-01-24: 13:00:00 1000 mL via INTRAVENOUS

## 2020-01-24 NOTE — Discharge Instructions (Signed)
[name]'s blood tests and urine test were reassuring.  Specifically there was no sign of a urine infection, kidney injury, dehydration, or anemia.  We sent a urine culture off which will result in 2 days.  If there is an abnormality, her primary care doctor should be able to follow up on the results of this.  Please schedule an appointment with Dr Jeannetta Nap office this week.  [name]'s thyroid function test had some minor abnormalities today.  We recommend that she get this test repeated in 1-2 weeks to ensure it is accurate.  Her doctor may wish to perform further tests or imaging of her thyroid if her doctor has other concerns.

## 2020-01-24 NOTE — ED Triage Notes (Signed)
Arrived via EMS from home. Low grade fever 99.9 (temporal), hx of UTI. Ambulatory with EMS, experiencing generalized weakness. A&O x4.

## 2020-01-24 NOTE — ED Provider Notes (Signed)
[facility]-EMERGENCY DEPT Provider Note   CSN: [id number] Arrival date & time: 01/24/20  1149     History CC: Weakness  [name] is a 86 y.o. female w/ hx of gastric ulcer, suspected early Alzheimer's dementia (per husband, no former diagnosis), recurrent UTI's, HTN, HLD, cardiomegaly, presented to the emergency department with weakness and fever.  Her husband reports that ever since leaving the hospital end of January, which time to treat her for UTI, the patient is never fully regained her strength.  However she is normally able to ambulate around the house, and her mental status does wax and wane but at times she is fully lucid and oriented.  He tells me that she has been treated three times for UTI's by her PCP since her discharge from the hospital in Jan 2021, most recently March 18th, when she completed a course of macrobid and has continued taking macrobid once daily as prophylaxis.    Today he noted she had a temp of 101F at home, and seemed weaker than normal.  He tells me she couldn't get out of bed, and that's unusual for her, and a new change.  He says her urine hasn't smelled foul recently as she tends to have with her UTI's.  He hasn't noted any fever, congestion, cough, or other symptoms at home.  During her Jan hospitalization, record review shows she was treated for UTI and mild hypokalemia and an AKI, resolved with IVF.   Supplemental history provided by the patient's husband Sharlyne Koeneman on the phone.   On my exam the patient is oriented, has no acute complaints.  She tells me she did not feel weak this morning, and she was able to ambulate here.  She laughs and says her husband is "bullshitting" about her weakness, and that she feels fine.  HPI     Past Medical History:  Diagnosis Date  . Cardiomegaly    Hattie Perch 02/17/2013  . Gastric ulcer   . High cholesterol   . History of blood transfusion   . Hypertension   . UTI (lower urinary tract  infection)     Patient Active Problem List   Diagnosis Date Noted  . Generalized weakness 11/17/2019  . UTI (urinary tract infection) 11/17/2019  . Acute metabolic encephalopathy 11/17/2019  . Anemia 11/17/2019  . Dehydration 01/16/2014  . Nausea and vomiting 01/16/2014  . Elevated lactic acid level 01/16/2014  . AKI (acute kidney injury) (HCC) 01/16/2014  . L1 vertebral fracture (HCC) 01/13/2014  . Protein-calorie malnutrition, severe (HCC) 03/24/2013  . Hypokalemia 02/17/2013  . Hypertension 02/17/2013  . Hyperlipemia 02/17/2013    Past Surgical History:  Procedure Laterality Date  . ESOPHAGOGASTRODUODENOSCOPY ENDOSCOPY    . KYPHOPLASTY N/A 01/13/2014   Procedure: Lumbar one KYPHOPLASTY;  Surgeon: Barnett Abu, MD;  Location: [facility];  Service: Neurosurgery;  Laterality: N/A;  L1 KYPHOPLASTY  . NO PAST SURGERIES       OB History   No obstetric history on file.     No family history on file.  Social History   Tobacco Use  . Smoking status: Current Every Day Smoker    Packs/day: 0.12    Years: 60.00    Pack years: 7.20    Types: Cigarettes  . Smokeless tobacco: Never Used  Substance Use Topics  . Alcohol use: No  . Drug use: No    Home Medications Prior to Admission medications   Medication Sig Start Date End Date Taking? Authorizing Provider  acetaminophen (TYLENOL) 500 MG tablet Take 500 mg by mouth See admin instructions. Take 500 mg by mouth in the morning and an additional 500 mg once a day as needed for pain   Yes [provider]  alendronate (FOSAMAX) 70 MG tablet Take 70 mg by mouth every Wednesday.  01/12/14  Yes [provider]  atenolol (TENORMIN) 100 MG tablet Take 100 mg by mouth at bedtime.    Yes [provider]  B Complex Vitamins (B COMPLEX PO) Take 1 tablet by mouth daily.   Yes [provider]  Calcium Carbonate (CALCIUM 600 PO) Take 1,200 mg by mouth daily.   Yes [provider]    Cholecalciferol (VITAMIN D-3) 25 MCG (1000 UT) CAPS Take 1,000 Units by mouth at bedtime.   Yes [provider]  docusate sodium (COLACE) 100 MG capsule Take 100 mg by mouth at bedtime.    Yes [provider]  Famotidine (PEPCID AC MAXIMUM STRENGTH) 20 MG CHEW Chew 20 mg by mouth at bedtime.   Yes [provider]  lisinopril (ZESTRIL) 10 MG tablet Take 10 mg by mouth at bedtime.    Yes [provider]  nitrofurantoin, macrocrystal-monohydrate, (MACROBID) 100 MG capsule Take 1 capsule by mouth at bedtime. 01/07/20  Yes [provider]  traMADol (ULTRAM) 50 MG tablet Take 50 mg by mouth See admin instructions. Take 50 mg by mouth in the morning and an additional 50 mg once a day as needed for pain 09/19/19  Yes [provider]  Vitamin A 2400 MCG (8000 UT) TABS Take 2,400 mcg by mouth daily.   Yes [provider]  triamterene-hydrochlorothiazide (MAXZIDE) 75-50 MG per tablet Take 0.5 tablets by mouth daily. Patient not taking: Reported on 11/17/2019 01/19/14   Thurnell Lose, MD    Allergies    Patient has no known allergies.  Review of Systems   Review of Systems  Constitutional: Negative for chills and fever.  HENT: Negative for ear pain and sore throat.   Eyes: Negative for pain and visual disturbance.  Respiratory: Negative for cough and shortness of breath.   Cardiovascular: Negative for chest pain and palpitations.  Gastrointestinal: Negative for abdominal pain, nausea and vomiting.  Genitourinary: Negative for dysuria and hematuria.  Musculoskeletal: Negative for arthralgias and myalgias.  Skin: Negative for color change and rash.  Neurological: Negative for syncope and headaches.  All other systems reviewed and are negative.   Physical Exam Updated Vital Signs BP (!) 148/62   Pulse 76   Temp 98.8 F (37.1 C) (Oral)   Resp 16   Ht 5\' 2"  (1.575 m)   Wt 49.9 kg   SpO2 96%   BMI 20.12 kg/m   Physical  Exam Vitals and nursing note reviewed.  Constitutional:      General: She is not in acute distress.    Appearance: She is well-developed.  HENT:     Head: Normocephalic and atraumatic.  Eyes:     Conjunctiva/sclera: Conjunctivae normal.     Pupils: Pupils are equal, round, and reactive to light.  Cardiovascular:     Rate and Rhythm: Normal rate and regular rhythm.     Pulses: Normal pulses.  Pulmonary:     Effort: Pulmonary effort is normal. No respiratory distress.     Breath sounds: Normal breath sounds.  Abdominal:     Palpations: Abdomen is soft.     Tenderness: There is no abdominal tenderness.  Genitourinary:    Comments: Uterine/vaginal  prolapse that is soft, nontender, and can be partially reduced.  This is chronic according to the patient. Musculoskeletal:        General: No tenderness or deformity.     Cervical back: Neck supple.     Comments: Can bear weight and walk with hand-held assistance Full ROM at the hips and knees  Skin:    General: Skin is warm and dry.  Neurological:     General: No focal deficit present.     Mental Status: She is alert and oriented to person, place, and time.     Sensory: No sensory deficit.     Motor: No weakness.     ED Results / Procedures / Treatments   Labs (all labs ordered are listed, but only abnormal results are displayed) Labs Reviewed  BASIC METABOLIC PANEL - Abnormal; Notable for the following components:      Result Value   Glucose, Bld 119 (*)    All other components within normal limits  CBC WITH DIFFERENTIAL/PLATELET - Abnormal; Notable for the following components:   RBC 3.83 (*)    Hemoglobin 11.6 (*)    HCT 35.6 (*)    Lymphs Abs 0.5 (*)    All other components within normal limits  URINALYSIS, ROUTINE W REFLEX MICROSCOPIC - Abnormal; Notable for the following components:   Ketones, ur 5 (*)    All other components within normal limits  TSH - Abnormal; Notable for the following components:   TSH 0.238 (*)     All other components within normal limits  BRAIN NATRIURETIC PEPTIDE - Abnormal; Notable for the following components:   B Natriuretic Peptide 111.7 (*)    All other components within normal limits  T4, FREE - Abnormal; Notable for the following components:   Free T4 1.22 (*)    All other components within normal limits  URINE CULTURE  MAGNESIUM  T3, FREE  POC SARS CORONAVIRUS 2 AG -  ED    EKG EKG Interpretation  Date/Time:  Sunday January 24 2020 12:27:02 EDT Ventricular Rate:  79 PR Interval:    QRS Duration: 98 QT Interval:  407 QTC Calculation: 467 R Axis:   40 Text Interpretation: Sinus rhythm Borderline T abnormalities, anterior leads No significant change from Jan 2021 ecg, No STEMI Confirmed by Alvester Chou [phone]) on 01/24/2020 12:30:50 PM   Radiology DG Chest Portable 1 View  Result Date: 01/24/2020 CLINICAL DATA:  Fever EXAM: PORTABLE CHEST 1 VIEW COMPARISON:  November 17, 2019 FINDINGS: There is no edema or airspace opacity. Heart is upper normal in size with pulmonary vascularity normal. No adenopathy. Status post kyphoplasty procedure in upper lumbar region. IMPRESSION: No edema or airspace opacity. Stable cardiac silhouette. No adenopathy. Electronically Signed   By: Bretta Bang III M.D.   On: 01/24/2020 12:52    Procedures Procedures (including critical care time)  Medications Ordered in ED Medications  sodium chloride 0.9 % bolus 1,000 mL (0 mLs Intravenous Stopped 01/24/20 1800)    ED Course  I have reviewed the triage vital signs and the nursing notes.  Pertinent labs & imaging results that were available during my care of the patient were reviewed by me and considered in my medical decision making (see chart for details).  This patient presents with complaint of fever of unknown origin, with reported temp 101F, and family member's concern for weakness this morning .  This involves an extensive number of treatment options, and is a complaint that  carries with it  a high risk of complications and morbidity.  The differential diagnosis includes infection (UTI, PNA, COVID), anemia, metabolic derangements, and hypothyroidism.  Less likely PE or ACS based on this presentation.  I ordered, reviewed, and interpreted labs, which included ECG, TSH, BMP, CBC, BNP I ordered medication IV fluids for hydration  I ordered imaging studies which included chest xray. I independently visualized and interpreted imaging which showed no focal opacities or consolidations suggestive of pneumonia, in addition to radiologist's findings  Additional history was obtained from husband Jhoselyn Ruffini, regarding patient's medical course at home and history of medical issues Previous records obtained and reviewed showing discharge summary from January with hx of UTI, hypokalemia at that time  After the interventions stated above, I reevaluated the patient and found she felt well, with no new symptoms, no pain or complaints.  I am doubtful these minor thyroid abnormalities are the cause of her symptoms.  I discussed her uterine prolapse and medical w/u with her husband.  She is stable for discharge home at this time, but needs PCP f/u for her thyroid and OB f/u for her prolapse.  No sign of UTI today.  The patient appears reasonably screened and/or stabilized for discharge and I doubt any other medical condition or other [facility] requiring further screening, evaluation, or treatment in the ED at this time prior to discharge.   Clinical Course as of Jan 23 2342  Wynelle Link Jan 24, 2020  1224 Patient unable to provide urine sample on arrival, will give IVF and attempt again   [MT]  1742 Updated patient and her husband about lab results.  Recommending PCP f/u this week for recheck of thyroid function.  No prior hx of thyroid disease.  This does not seem clinically like hyperthyroidism or thyroid storm to me.  She continues to be well appearing, normal vitals, afebrile in the room.   [MT]    1901 Husband has arrived to pick her up.  I informed him about her vaginal prolapse, which I found on my later exam.  This may be contributing to her recurrent UTI's.  The vaginal contents are soft and easily invertable.  I don't think this needs emergent attention, likely chronic, but I did advise they f/u with OB, and he tells me he will make an appointment with the [facility] provider    [MT]    Clinical Course User Index [MT] Imanni Burdine, Kermit Balo, MD    Final Clinical Impression(s) / ED Diagnoses Final diagnoses:  Weakness  Thyroid function study abnormality    Rx / DC Orders ED Discharge Orders    None       Terald Sleeper, MD 01/24/20 [phone]

## 2020-01-24 NOTE — Progress Notes (Signed)
Returned a call from EDP regarding abnormal TSH 0.238 and free T4 1.22.  Possibly endogenous and is asymptomatic.  Reviewed EDP's note.  Recommended follow up with PCP for possible work up or referral to endocrinology.  May possibly need a thyroid uptake scan outpatient.  Vital signs and other labs reviewed.  No tachycardia, in SR on 12 lead EKG. Dr. Renaye Rakers agreeable.  Will have the patient follow up with her PCP.  No charge note.

## 2020-01-24 NOTE — ED Notes (Signed)
Walked in to D/C the patient and she was found on the floor. MD notified, no injuries present. Patient took off her non-slip socks.

## 2020-01-25 LAB — URINE CULTURE
Culture: NO GROWTH
Special Requests: NORMAL

## 2021-04-01 IMAGING — DX DG CHEST 2V
2 series · 2 of 2 positions shown · non-contrast
Comparison: January 16, 2014

CLINICAL DATA: Generalized weakness.  Hypertension.

EXAM:
CHEST - 2 VIEW

[chest lat]
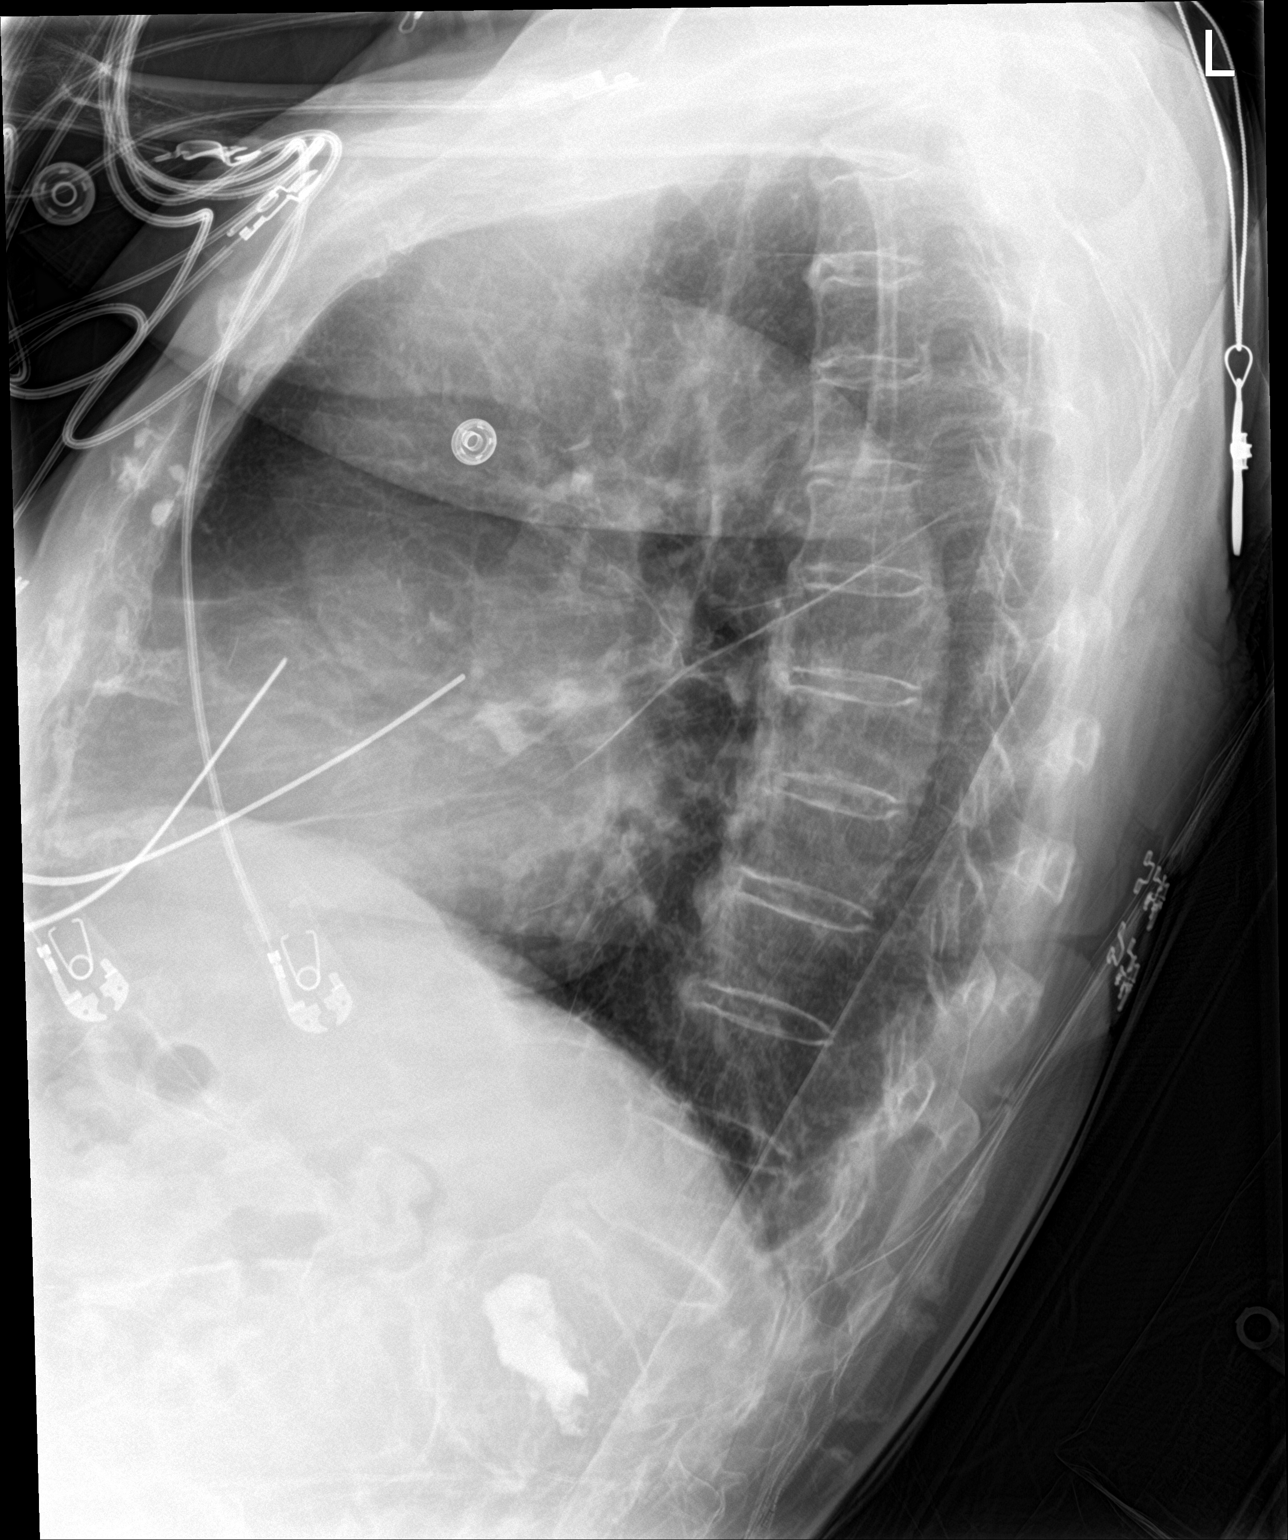

[chest ap]
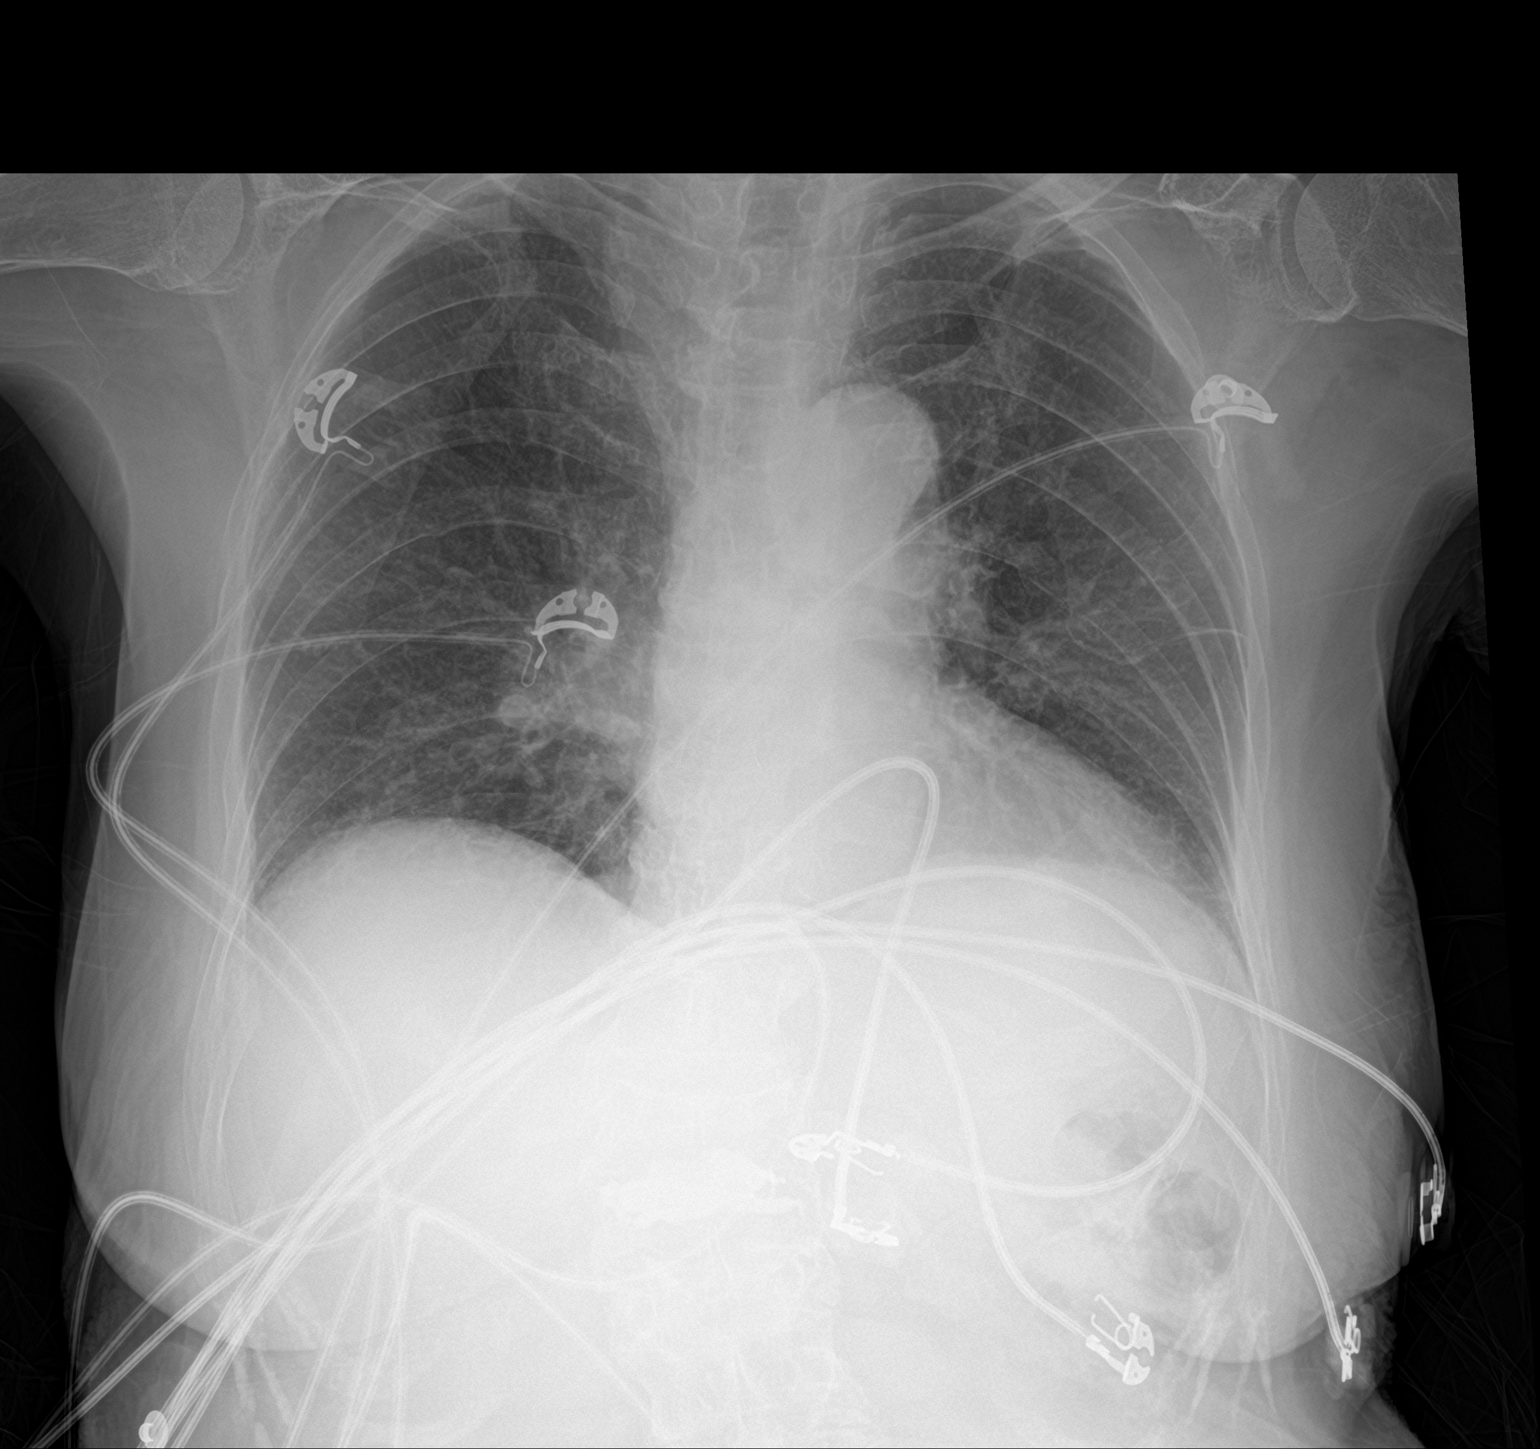

[2 of 2 positions shown; findings below may reference images not displayed]

FINDINGS: There is slight left midlung atelectatic change. Lungs elsewhere are
clear. Heart size and pulmonary vascularity are normal. No
adenopathy. There is degenerative change in the thoracic spine.
Bones are osteoporotic. Patient has undergone previous kyphoplasty
procedure in the upper lumbar region. There is marked collapse of an
upper lumbar vertebral body.
IMPRESSION: No edema or consolidation. Mild atelectasis left mid lung. No
adenopathy. Bones osteoporotic.

## 2021-04-01 IMAGING — CT CT HEAD W/O CM
4 series · 17 of 47 positions shown, 19 images · non-contrast
Comparison: None.

CLINICAL DATA: Increasing generalized weakness for 2 days.

EXAM:
CT HEAD WITHOUT CONTRAST
TECHNIQUE: Contiguous axial images were obtained from the base of the skull
through the vertex without intravenous contrast.

[Series 3: head wo · axial · 0.41mm/px · z∈[+1114,+1254]mm · 7 of 38 slices shown, 9 images]
[im 5/38  brain]
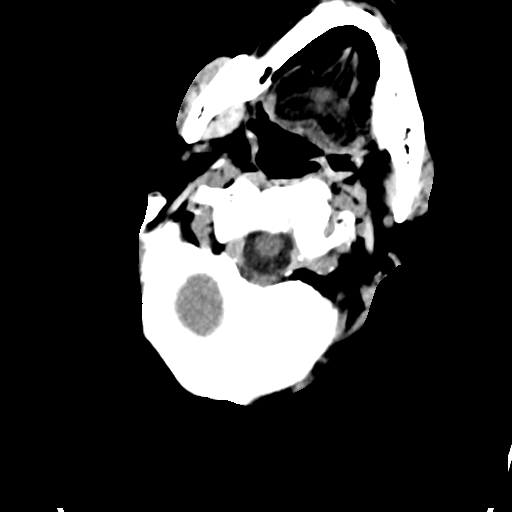
[im 5/38  bone]
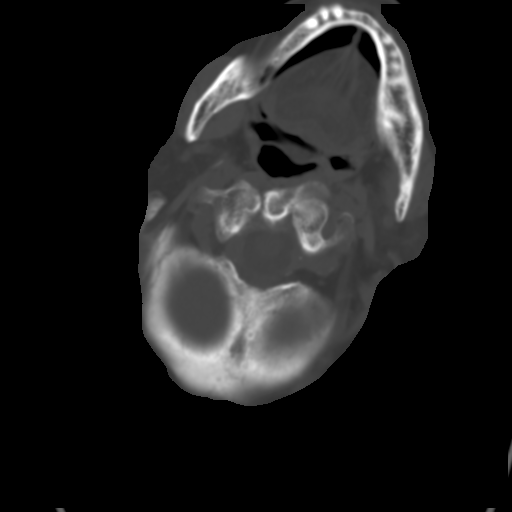
[im 10/38  brain]
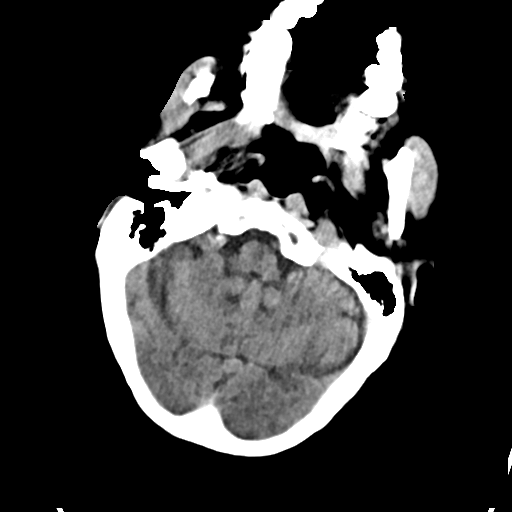
[im 14/38  brain]
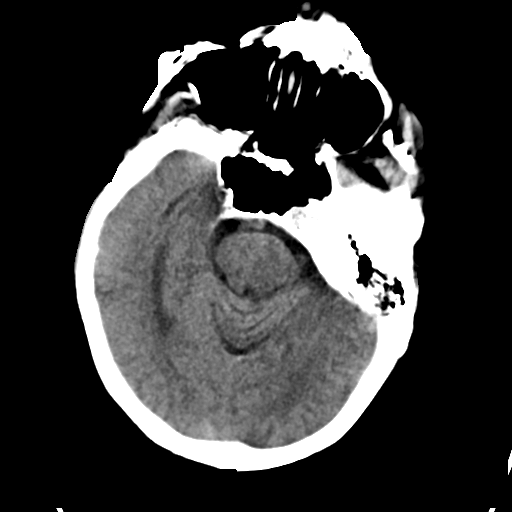
[im 19/38  brain]
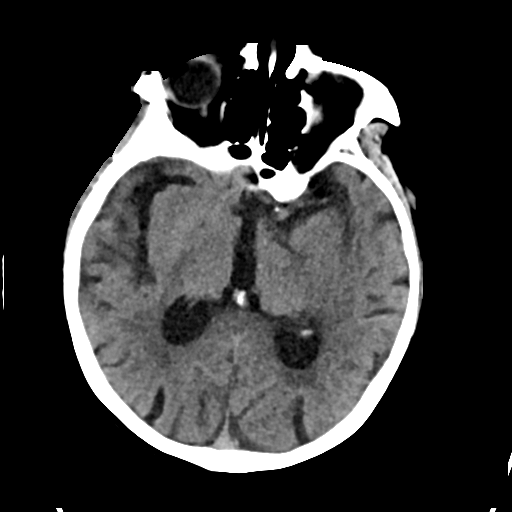
[im 24/38  brain]
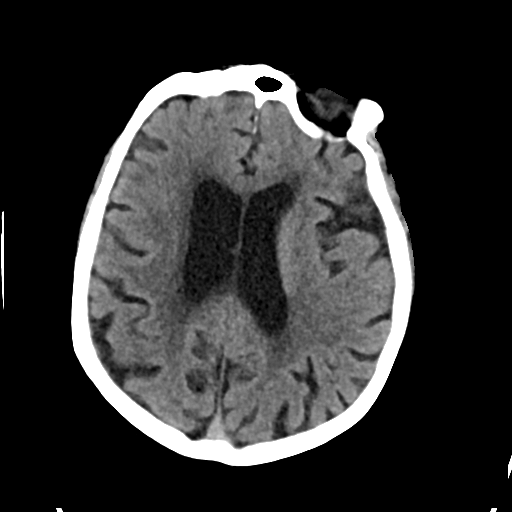
[im 24/38  bone]
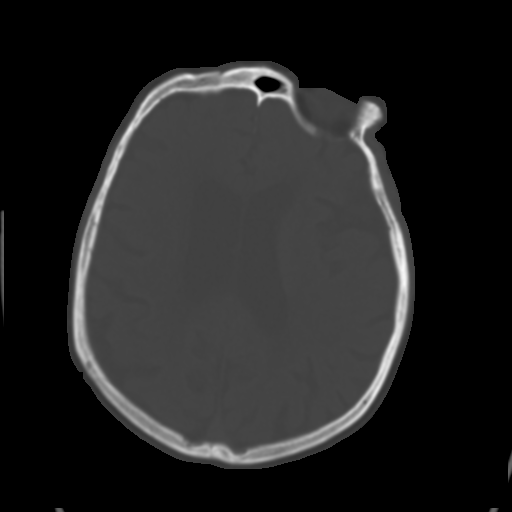
[im 28/38  brain]
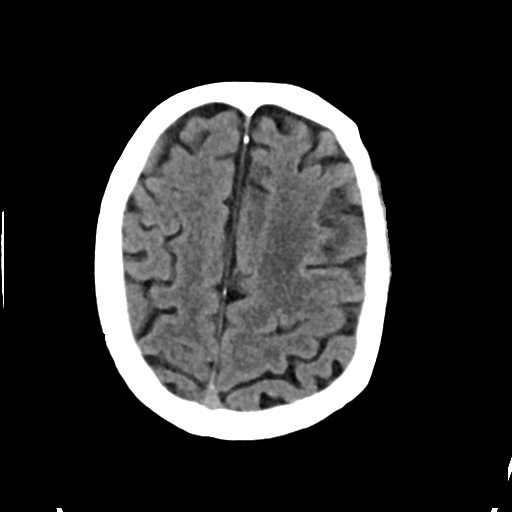
[im 33/38  brain]
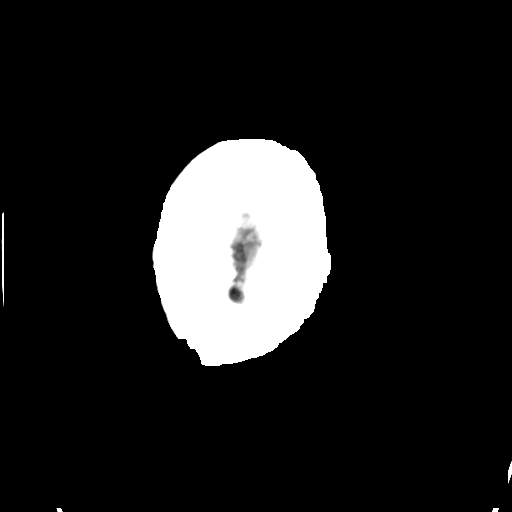

[Series 4: head bone · axial · 0.41mm/px · z∈[+1112,+1176]mm · 4 of 94 slices shown]
[im 10/94  bone]
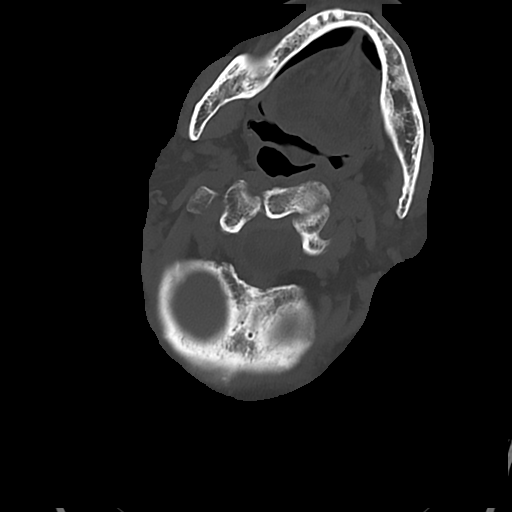
[im 19/94  bone]
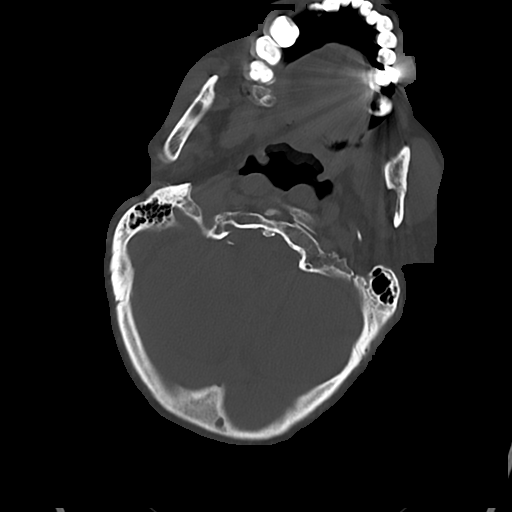
[im 28/94  bone]
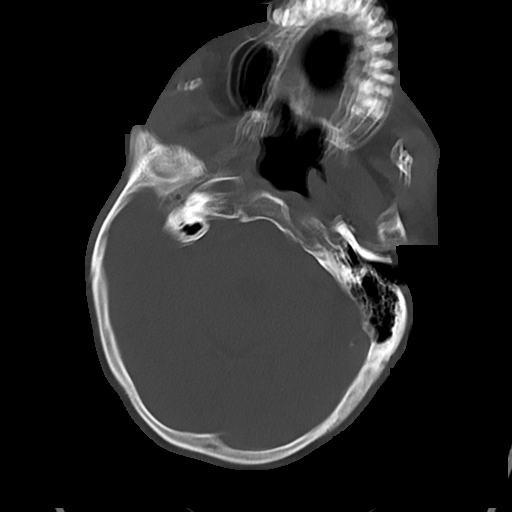
[im 42/94  bone]
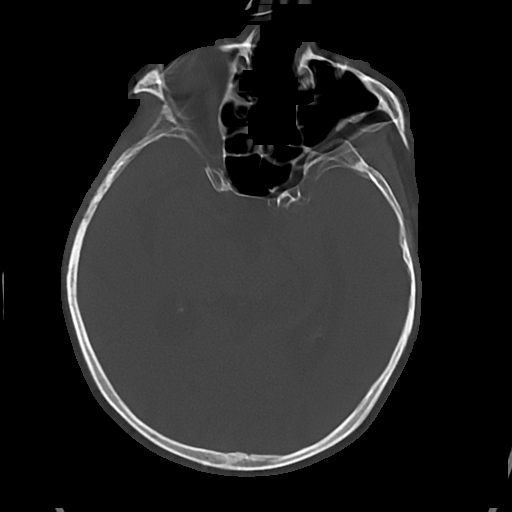

[Series 5: cor soft · coronal · 0.35mm/px · 3 of 73 slices shown]
[im 25/73  brain]
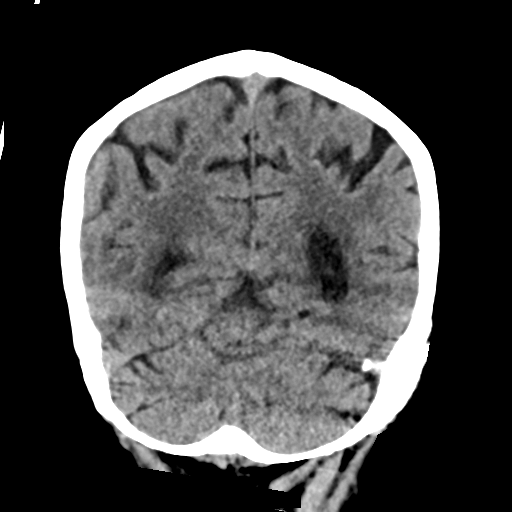
[im 33/73  brain]
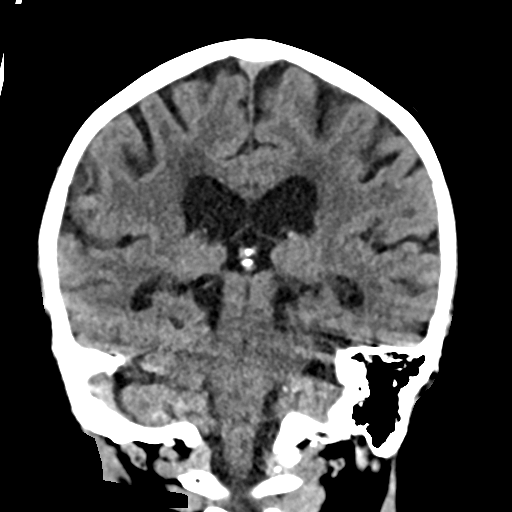
[im 41/73  brain]
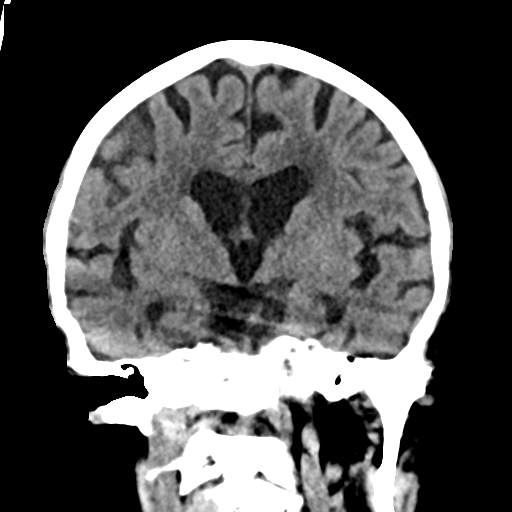

[Series 6: sag soft · sagittal · 0.38mm/px · 3 of 59 slices shown]
[im 23/59  brain]
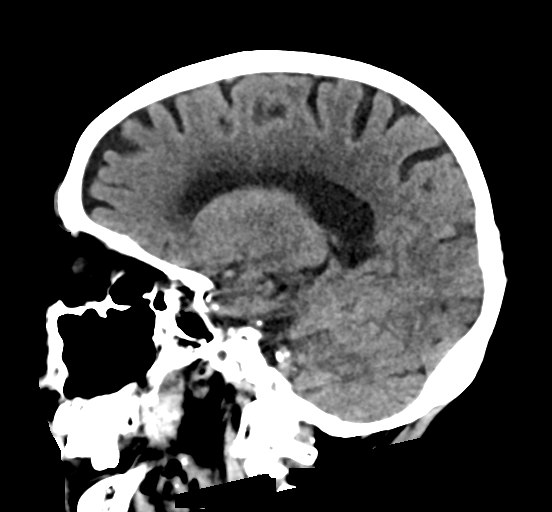
[im 29/59  brain]
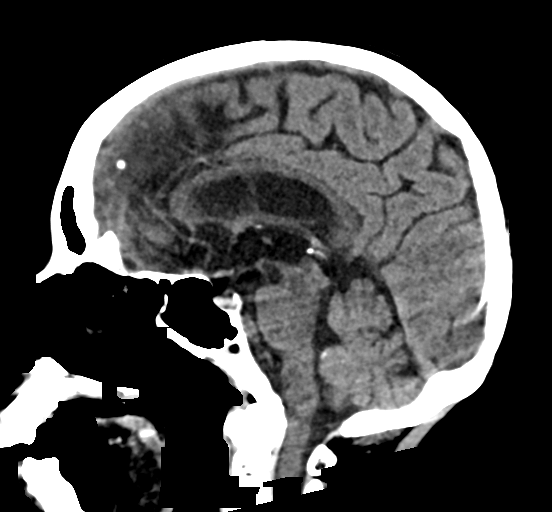
[im 36/59  brain]
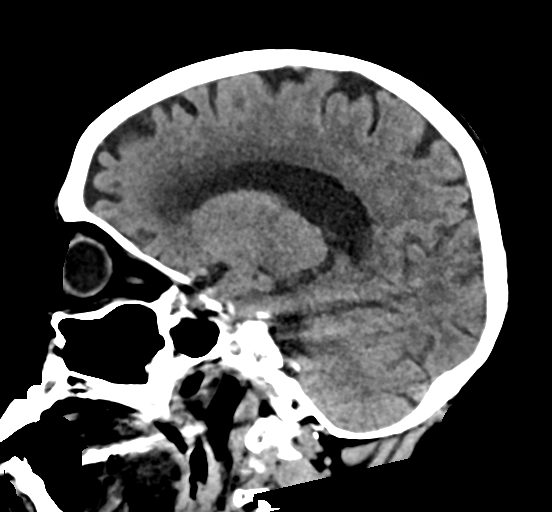

[17 of 47 positions shown; findings below may reference images not displayed]

FINDINGS: Brain: There is no evidence for acute hemorrhage, hydrocephalus,
mass lesion, or abnormal extra-axial fluid collection. No definite
CT evidence for acute infarction. Diffuse loss of parenchymal volume
is consistent with atrophy. Patchy low attenuation in the deep
hemispheric and periventricular white matter is nonspecific, but
likely reflects chronic microvascular ischemic demyelination.

Vascular: No hyperdense vessel or unexpected calcification.

Skull: No evidence for fracture. No worrisome lytic or sclerotic
lesion.

Sinuses/Orbits: Assessment motion degraded without gross
opacification. Visualized portions of the globes and intraorbital
fat are unremarkable.

Other: None.
IMPRESSION: 1. No acute intracranial abnormality.
2. Atrophy with chronic small vessel white matter ischemic disease.

## 2022-03-09 ENCOUNTER — Emergency Department (HOSPITAL_COMMUNITY): Payer: Medicare Other

## 2022-03-09 ENCOUNTER — Inpatient Hospital Stay (HOSPITAL_COMMUNITY)
Admission: EM | Admit: 2022-03-09 | Discharge: 2022-03-12 | DRG: 689 | Disposition: A | Discharge status: Home Health | Payer: Medicare Other | Attending: Internal Medicine | Admitting: Internal Medicine

## 2022-03-09 ENCOUNTER — Encounter (HOSPITAL_COMMUNITY): Payer: Self-pay | Admitting: Emergency Medicine

## 2022-03-09 ENCOUNTER — Other Ambulatory Visit: Payer: Self-pay

## 2022-03-09 DIAGNOSIS — R531 Weakness: Secondary | ICD-10-CM | POA: Diagnosis not present

## 2022-03-09 DIAGNOSIS — E869 Volume depletion, unspecified: Secondary | ICD-10-CM | POA: Diagnosis present

## 2022-03-09 DIAGNOSIS — Y929 Unspecified place or not applicable: Secondary | ICD-10-CM

## 2022-03-09 DIAGNOSIS — N3000 Acute cystitis without hematuria: Principal | ICD-10-CM | POA: Diagnosis present

## 2022-03-09 DIAGNOSIS — W19XXXA Unspecified fall, initial encounter: Secondary | ICD-10-CM | POA: Diagnosis present

## 2022-03-09 DIAGNOSIS — K219 Gastro-esophageal reflux disease without esophagitis: Secondary | ICD-10-CM | POA: Diagnosis present

## 2022-03-09 DIAGNOSIS — E782 Mixed hyperlipidemia: Secondary | ICD-10-CM | POA: Diagnosis present

## 2022-03-09 DIAGNOSIS — E876 Hypokalemia: Secondary | ICD-10-CM | POA: Diagnosis not present

## 2022-03-09 DIAGNOSIS — Z79899 Other long term (current) drug therapy: Secondary | ICD-10-CM

## 2022-03-09 DIAGNOSIS — R4189 Other symptoms and signs involving cognitive functions and awareness: Secondary | ICD-10-CM | POA: Diagnosis present

## 2022-03-09 DIAGNOSIS — G9341 Metabolic encephalopathy: Secondary | ICD-10-CM | POA: Diagnosis present

## 2022-03-09 DIAGNOSIS — I10 Essential (primary) hypertension: Secondary | ICD-10-CM | POA: Diagnosis present

## 2022-03-09 DIAGNOSIS — N390 Urinary tract infection, site not specified: Secondary | ICD-10-CM

## 2022-03-09 DIAGNOSIS — F0390 Unspecified dementia without behavioral disturbance: Secondary | ICD-10-CM | POA: Diagnosis present

## 2022-03-09 DIAGNOSIS — F17210 Nicotine dependence, cigarettes, uncomplicated: Secondary | ICD-10-CM | POA: Diagnosis present

## 2022-03-09 MED ORDER — LACTATED RINGERS IV BOLUS
1000.0000 mL | Freq: Once | INTRAVENOUS | Status: AC
  Administered 2022-03-10: 1000 mL via INTRAVENOUS

## 2022-03-09 NOTE — ED Triage Notes (Signed)
From home for dysuria x 2 weeks. AMS at baseline d/t dementia, at baseline confusion today. No meds with EMS. Arrives alert.  H/o htn, dementia

## 2022-03-09 NOTE — ED Provider Notes (Signed)
[facility] EMERGENCY DEPARTMENT Provider Note   CSN: [id number] Arrival date & time: 03/09/22  2222     History  Chief Complaint  Patient presents with   Dysuria    [name] is a 88 y.o. female.  88 yo F w/ dementia who presents to the ED via EMS. Patietn does not know why she is here and states that she feels well. EMS states dysuria for two weeks. I spoke with her son on the phone who is her caretaker. He states no UTI symptoms but is prone to UTI's and gets weak like this sometimes when she has had it. He states that she had a fall earlier today that was unwitnessed but seemed to be related to weakness. Did ok throughout the earlier part of the day but this afternoon/evening her weakness got worse again and to the point she couldn't stand to get out of the chair so he sent her here for eval. No recent illnesses. No focal deficitis. No h/o cva/mi. Patient without complaints.    Dysuria     Home Medications Prior to Admission medications   Medication Sig Start Date End Date Taking? Authorizing Provider  acetaminophen (TYLENOL) 500 MG tablet Take 500 mg by mouth See admin instructions. Take 500 mg by mouth in the morning and an additional 500 mg once a day as needed for pain    [provider]  alendronate (FOSAMAX) 70 MG tablet Take 70 mg by mouth every Wednesday.  01/12/14   [provider]  atenolol (TENORMIN) 100 MG tablet Take 100 mg by mouth at bedtime.     [provider]  B Complex Vitamins (B COMPLEX PO) Take 1 tablet by mouth daily.    [provider]  Calcium Carbonate (CALCIUM 600 PO) Take 1,200 mg by mouth daily.    [provider]  Cholecalciferol (VITAMIN D-3) 25 MCG (1000 UT) CAPS Take 1,000 Units by mouth at bedtime.    [provider]  docusate sodium (COLACE) 100 MG capsule Take 100 mg by mouth at bedtime.     [provider]  Famotidine (PEPCID AC MAXIMUM STRENGTH) 20 MG CHEW Chew  20 mg by mouth at bedtime.    [provider]  lisinopril (ZESTRIL) 10 MG tablet Take 10 mg by mouth at bedtime.     [provider]  nitrofurantoin, macrocrystal-monohydrate, (MACROBID) 100 MG capsule Take 1 capsule by mouth at bedtime. 01/07/20   [provider]  traMADol (ULTRAM) 50 MG tablet Take 50 mg by mouth See admin instructions. Take 50 mg by mouth in the morning and an additional 50 mg once a day as needed for pain 09/19/19   [provider]  triamterene-hydrochlorothiazide (MAXZIDE) 75-50 MG per tablet Take 0.5 tablets by mouth daily. Patient not taking: Reported on 11/17/2019 01/19/14   Leroy Sea, MD  Vitamin A 2400 MCG (8000 UT) TABS Take 2,400 mcg by mouth daily.    [provider]      Allergies    Patient has no known allergies.    Review of Systems   Review of Systems  Physical Exam Updated Vital Signs BP (!) 161/79   Pulse 80   Temp 99.2 F (37.3 C) (Oral)   Resp 18   Wt 50 kg   SpO2 98%   BMI 20.16 kg/m  Physical Exam Vitals and nursing note reviewed.  Constitutional:      Appearance: She is well-developed.  HENT:  Head: Normocephalic and atraumatic.     Mouth/Throat:     Mouth: Mucous membranes are moist.     Pharynx: Oropharynx is clear.  Eyes:     Pupils: Pupils are equal, round, and reactive to light.  Cardiovascular:     Rate and Rhythm: Normal rate and regular rhythm.  Pulmonary:     Effort: No respiratory distress.     Breath sounds: No stridor.  Abdominal:     General: Abdomen is flat. There is no distension.  Musculoskeletal:        General: No swelling or tenderness. Normal range of motion.     Cervical back: Normal range of motion.  Skin:    General: Skin is warm and dry.  Neurological:     General: No focal deficit present.     Mental Status: She is alert. Mental status is at baseline.    ED Results / Procedures / Treatments   Labs (all labs ordered are listed, but only  abnormal results are displayed) Labs Reviewed  URINALYSIS, ROUTINE W REFLEX MICROSCOPIC  CBC WITH DIFFERENTIAL/PLATELET  COMPREHENSIVE METABOLIC PANEL  MAGNESIUM  TROPONIN I (HIGH SENSITIVITY)    EKG None  Radiology No results found.  Procedures Procedures    Medications Ordered in ED Medications  lactated ringers bolus 1,000 mL (has no administration in time range)    ED Course/ Medical Decision Making/ A&P                           Medical Decision Making Amount and/or Complexity of Data Reviewed Labs: ordered. Radiology: ordered. ECG/medicine tests: ordered.  Risk Decision regarding hospitalization.  Unclear etiology for generalized weaknes. Will institute broad workup.  Found to have UTI, possibly mild dehydration. Here with generalized weakness and not able to walk without significant assistance. Not improving with fluids. Will d/w medicine for admission.   Final Clinical Impression(s) / ED Diagnoses Final diagnoses:  None    Rx / DC Orders ED Discharge Orders     None         Abhishek Levesque, Barbara Cower, MD 03/11/22 [phone]

## 2022-03-10 DIAGNOSIS — K219 Gastro-esophageal reflux disease without esophagitis: Secondary | ICD-10-CM | POA: Diagnosis present

## 2022-03-10 DIAGNOSIS — Z79899 Other long term (current) drug therapy: Secondary | ICD-10-CM | POA: Diagnosis not present

## 2022-03-10 DIAGNOSIS — F0390 Unspecified dementia without behavioral disturbance: Secondary | ICD-10-CM | POA: Diagnosis present

## 2022-03-10 DIAGNOSIS — F17210 Nicotine dependence, cigarettes, uncomplicated: Secondary | ICD-10-CM | POA: Diagnosis present

## 2022-03-10 DIAGNOSIS — Z723 Lack of physical exercise: Secondary | ICD-10-CM | POA: Diagnosis not present

## 2022-03-10 DIAGNOSIS — R4189 Other symptoms and signs involving cognitive functions and awareness: Secondary | ICD-10-CM | POA: Diagnosis present

## 2022-03-10 DIAGNOSIS — G9341 Metabolic encephalopathy: Secondary | ICD-10-CM | POA: Diagnosis present

## 2022-03-10 DIAGNOSIS — I10 Essential (primary) hypertension: Secondary | ICD-10-CM | POA: Diagnosis present

## 2022-03-10 DIAGNOSIS — E869 Volume depletion, unspecified: Secondary | ICD-10-CM | POA: Diagnosis present

## 2022-03-10 DIAGNOSIS — E782 Mixed hyperlipidemia: Secondary | ICD-10-CM

## 2022-03-10 DIAGNOSIS — N134 Hydroureter: Secondary | ICD-10-CM | POA: Diagnosis not present

## 2022-03-10 DIAGNOSIS — N3000 Acute cystitis without hematuria: Secondary | ICD-10-CM | POA: Diagnosis present

## 2022-03-10 DIAGNOSIS — R5381 Other malaise: Secondary | ICD-10-CM | POA: Diagnosis not present

## 2022-03-10 DIAGNOSIS — R000 Tachycardia, unspecified: Secondary | ICD-10-CM | POA: Diagnosis not present

## 2022-03-10 DIAGNOSIS — Y929 Unspecified place or not applicable: Secondary | ICD-10-CM | POA: Diagnosis not present

## 2022-03-10 DIAGNOSIS — M6281 Muscle weakness (generalized): Secondary | ICD-10-CM | POA: Diagnosis present

## 2022-03-10 DIAGNOSIS — N390 Urinary tract infection, site not specified: Secondary | ICD-10-CM | POA: Diagnosis not present

## 2022-03-10 DIAGNOSIS — E876 Hypokalemia: Secondary | ICD-10-CM | POA: Diagnosis not present

## 2022-03-10 DIAGNOSIS — N1330 Unspecified hydronephrosis: Secondary | ICD-10-CM | POA: Diagnosis not present

## 2022-03-10 DIAGNOSIS — W19XXXA Unspecified fall, initial encounter: Secondary | ICD-10-CM | POA: Diagnosis present

## 2022-03-10 DIAGNOSIS — R531 Weakness: Secondary | ICD-10-CM | POA: Diagnosis present

## 2022-03-10 DIAGNOSIS — R6510 Systemic inflammatory response syndrome (SIRS) of non-infectious origin without acute organ dysfunction: Secondary | ICD-10-CM | POA: Diagnosis not present

## 2022-03-10 LAB — URINALYSIS, ROUTINE W REFLEX MICROSCOPIC
Bilirubin Urine: NEGATIVE
Glucose, UA: NEGATIVE mg/dL
Ketones, ur: 20 mg/dL — AB
Nitrite: NEGATIVE
Protein, ur: NEGATIVE mg/dL
Specific Gravity, Urine: 1.006 (ref 1.005–1.030)
WBC, UA: >50 WBC/hpf — ABNORMAL HIGH (ref 0–5)
pH: 6 (ref 5.0–8.0)

## 2022-03-10 LAB — CBC WITH DIFFERENTIAL/PLATELET
Abs Immature Granulocytes: 0.07 10*3/uL (ref 0.00–0.07)
Abs Immature Granulocytes: 0.04 10*3/uL (ref 0.00–0.07)
Basophils Absolute: 0.1 10*3/uL (ref 0.0–0.1)
Basophils Absolute: 0.1 10*3/uL (ref 0.0–0.1)
Basophils Relative: 0 %
Basophils Relative: 1 %
Eosinophils Absolute: 0 10*3/uL (ref 0.0–0.5)
Eosinophils Absolute: 0 10*3/uL (ref 0.0–0.5)
Eosinophils Relative: 0 %
Eosinophils Relative: 0 %
HCT: 38.7 % (ref 36.0–46.0)
HCT: 37.3 % (ref 36.0–46.0)
Hemoglobin: 12.7 g/dL (ref 12.0–15.0)
Hemoglobin: 12.5 g/dL (ref 12.0–15.0)
Immature Granulocytes: 1 %
Immature Granulocytes: 0 %
Lymphocytes Relative: 7 %
Lymphocytes Relative: 8 %
Lymphs Abs: 0.8 10*3/uL (ref 0.7–4.0)
Lymphs Abs: 0.8 10*3/uL (ref 0.7–4.0)
MCH: 30.4 pg (ref 26.0–34.0)
MCH: 30.4 pg (ref 26.0–34.0)
MCHC: 32.8 g/dL (ref 30.0–36.0)
MCHC: 33.5 g/dL (ref 30.0–36.0)
MCV: 92.6 fL (ref 80.0–100.0)
MCV: 90.8 fL (ref 80.0–100.0)
Monocytes Absolute: 1 10*3/uL (ref 0.1–1.0)
Monocytes Absolute: 1 10*3/uL (ref 0.1–1.0)
Monocytes Relative: 9 %
Monocytes Relative: 9 %
Neutro Abs: 9.9 10*3/uL — ABNORMAL HIGH (ref 1.7–7.7)
Neutro Abs: 8.7 10*3/uL — ABNORMAL HIGH (ref 1.7–7.7)
Neutrophils Relative %: 83 %
Neutrophils Relative %: 82 %
Platelets: 179 10*3/uL (ref 150–400)
Platelets: 174 10*3/uL (ref 150–400)
RBC: 4.18 MIL/uL (ref 3.87–5.11)
RBC: 4.11 MIL/uL (ref 3.87–5.11)
RDW: 12.6 % (ref 11.5–15.5)
RDW: 12.6 % (ref 11.5–15.5)
WBC: 11.9 10*3/uL — ABNORMAL HIGH (ref 4.0–10.5)
WBC: 10.6 10*3/uL — ABNORMAL HIGH (ref 4.0–10.5)
nRBC: 0 % (ref 0.0–0.2)
nRBC: 0 % (ref 0.0–0.2)

## 2022-03-10 LAB — COMPREHENSIVE METABOLIC PANEL
ALT: 10 U/L (ref 0–44)
ALT: 9 U/L (ref 0–44)
AST: 17 U/L (ref 15–41)
AST: 18 U/L (ref 15–41)
Albumin: 3.9 g/dL (ref 3.5–5.0)
Albumin: 3.7 g/dL (ref 3.5–5.0)
Alkaline Phosphatase: 76 U/L (ref 38–126)
Alkaline Phosphatase: 75 U/L (ref 38–126)
Anion gap: 12 (ref 5–15)
Anion gap: 9 (ref 5–15)
BUN: 16 mg/dL (ref 8–23)
BUN: 10 mg/dL (ref 8–23)
CO2: 22 mmol/L (ref 22–32)
CO2: 27 mmol/L (ref 22–32)
Calcium: 9.3 mg/dL (ref 8.9–10.3)
Calcium: 9.3 mg/dL (ref 8.9–10.3)
Chloride: 102 mmol/L (ref 98–111)
Chloride: 102 mmol/L (ref 98–111)
Creatinine, Ser: 0.71 mg/dL (ref 0.44–1.00)
Creatinine, Ser: 0.68 mg/dL (ref 0.44–1.00)
GFR, Estimated: >60 mL/min (ref >60)
GFR, Estimated: >60 mL/min (ref >60)
Glucose, Bld: 131 mg/dL — ABNORMAL HIGH (ref 70–99)
Glucose, Bld: 119 mg/dL — ABNORMAL HIGH (ref 70–99)
Potassium: 3.5 mmol/L (ref 3.5–5.1)
Potassium: 3 mmol/L — ABNORMAL LOW (ref 3.5–5.1)
Sodium: 136 mmol/L (ref 135–145)
Sodium: 138 mmol/L (ref 135–145)
Total Bilirubin: 1.4 mg/dL — ABNORMAL HIGH (ref 0.3–1.2)
Total Bilirubin: 0.8 mg/dL (ref 0.3–1.2)
Total Protein: 7.5 g/dL (ref 6.5–8.1)
Total Protein: 7.4 g/dL (ref 6.5–8.1)

## 2022-03-10 LAB — TROPONIN I (HIGH SENSITIVITY)
Troponin I (High Sensitivity): 5 ng/L (ref <18)
Troponin I (High Sensitivity): 6 ng/L (ref <18)

## 2022-03-10 LAB — MAGNESIUM
Magnesium: 1.9 mg/dL (ref 1.7–2.4)
Magnesium: 1.8 mg/dL (ref 1.7–2.4)

## 2022-03-10 LAB — T4, FREE: Free T4: 1.1 ng/dL (ref 0.61–1.12)

## 2022-03-10 LAB — TSH: TSH: 0.358 u[IU]/mL (ref 0.350–4.500)

## 2022-03-10 MED ORDER — ONDANSETRON HCL 4 MG PO TABS: 4.0000 mg | ORAL_TABLET | Freq: Four times a day (QID) | ORAL | Status: DC | PRN

## 2022-03-10 MED ORDER — SODIUM CHLORIDE 0.9 % IV SOLN
1.0000 g | INTRAVENOUS | Status: DC
  Administered 2022-03-11: 1 g via INTRAVENOUS
  Filled 2022-03-10: qty 10

## 2022-03-10 MED ORDER — ENOXAPARIN SODIUM 30 MG/0.3ML IJ SOSY
30.0000 mg | PREFILLED_SYRINGE | Freq: Every day | INTRAMUSCULAR | Status: DC
  Administered 2022-03-10 – 2022-03-11 (×2): 30 mg via SUBCUTANEOUS
  Filled 2022-03-10 (×2): qty 0.3

## 2022-03-10 MED ORDER — PANTOPRAZOLE SODIUM 40 MG PO TBEC
40.0000 mg | DELAYED_RELEASE_TABLET | Freq: Every day | ORAL | Status: DC
  Administered 2022-03-10 – 2022-03-12 (×3): 40 mg via ORAL
  Filled 2022-03-10 (×3): qty 1

## 2022-03-10 MED ORDER — LACTATED RINGERS IV BOLUS
1000.0000 mL | Freq: Once | INTRAVENOUS | Status: AC
  Administered 2022-03-10: 1000 mL via INTRAVENOUS

## 2022-03-10 MED ORDER — ACETAMINOPHEN 325 MG PO TABS: 650.0000 mg | ORAL_TABLET | Freq: Four times a day (QID) | ORAL | Status: DC | PRN

## 2022-03-10 MED ORDER — ONDANSETRON HCL 4 MG/2ML IJ SOLN: 4.0000 mg | Freq: Four times a day (QID) | INTRAMUSCULAR | Status: DC | PRN

## 2022-03-10 MED ORDER — SODIUM CHLORIDE 0.9 % IV SOLN
2.0000 g | Freq: Once | INTRAVENOUS | Status: AC
  Administered 2022-03-10: 2 g via INTRAVENOUS
  Filled 2022-03-10: qty 20

## 2022-03-10 MED ORDER — HYDRALAZINE HCL 20 MG/ML IJ SOLN
10.0000 mg | Freq: Four times a day (QID) | INTRAMUSCULAR | Status: DC | PRN
Start: 2022-03-10 — End: 2022-03-12
  Administered 2022-03-10: 10 mg via INTRAVENOUS
  Filled 2022-03-10: qty 1

## 2022-03-10 MED ORDER — LISINOPRIL 10 MG PO TABS
10.0000 mg | ORAL_TABLET | Freq: Every day | ORAL | Status: DC
  Administered 2022-03-10 – 2022-03-11 (×2): 10 mg via ORAL
  Filled 2022-03-10 (×2): qty 1

## 2022-03-10 MED ORDER — POLYETHYLENE GLYCOL 3350 17 G PO PACK: 17.0000 g | PACK | Freq: Every day | ORAL | Status: DC | PRN

## 2022-03-10 MED ORDER — ACETAMINOPHEN 650 MG RE SUPP: 650.0000 mg | Freq: Four times a day (QID) | RECTAL | Status: DC | PRN

## 2022-03-10 MED ORDER — ATENOLOL 50 MG PO TABS: 100.0000 mg | ORAL_TABLET | Freq: Every day | ORAL | Status: DC

## 2022-03-10 NOTE — Plan of Care (Signed)

## 2022-03-10 NOTE — Assessment & Plan Note (Signed)
   Acute metabolic encephalopathy likely secondary to underlying infection and some degree of volume depletion  Patient has been hydrated generously here in the emergency department 2 L of lactated Ringer solution  Patient is additionally been placed on intravenous ceftriaxone for underlying infection  Encephalopathy should be self-limiting  We will monitor for resolution of encephalopathy, consider expanding work-up if it does not rapidly improve

## 2022-03-10 NOTE — Assessment & Plan Note (Signed)
.   Resume patients home regimen of oral antihypertensives . Titrate antihypertensive regimen as necessary to achieve adequate BP control . PRN intravenous antihypertensives for excessively elevated blood pressure   

## 2022-03-10 NOTE — ED Notes (Addendum)
Pt is able to take a few steps in hall but is weak and requires assistance.

## 2022-03-10 NOTE — ED Notes (Signed)
Pt removed monitoring equipment and purewick, monitoring equipment and purewick placed back on pt

## 2022-03-10 NOTE — Assessment & Plan Note (Signed)
   Patient presenting with 2-week history of dysuria weakness and what seems to be concurrent development of confusion and lethargy  Urinalysis suggestive of urinary tract infection which is likely causing some degree of metabolic encephalopathy superimposed on patient's known history of dementia  Treating patient with intravenous ceftriaxone  Urine cultures pending, blood cultures ordered  Patient is already received 2 L of intravenous lactated Ringer's solution here in the emergency department

## 2022-03-10 NOTE — Assessment & Plan Note (Signed)
.   Documented history of hyperlipidemia however there are no drugs on the patient's home medications . Attending to confirm home meds

## 2022-03-10 NOTE — Progress Notes (Signed)
Pt is alert and oriented x1.   Pt also refuses to answer basic questions.  Her son is in route. Unable to complete admission questions

## 2022-03-10 NOTE — H&P (Signed)
History and Physical    Patient: [name] MRN: [id number] DOA: 03/09/2022  Date of Service: the patient was seen and examined on 03/10/2022  Patient coming from: Home via EMS  Chief Complaint:  Chief Complaint  Patient presents with   Weakness    HPI:   88 year old female with past medical history of dementia, hypertension, hyperlipidemia, gastroesophageal reflux disease, osteoporosis who presented to [facility] emergency department via EMS with complaints of dysuria.  Patient is a poor historian due to history of dementia and therefore majority of history is been obtained from both discussion with the emergency department staff, review of ED documentation as well as review of EMS documentation.  Patient has been experiencing dysuria for approximately the past 2 weeks.  As the symptoms have progressively worsened patient is also developed associated generalized weakness.  Patient has developed progressively worsening lethargy and confusion over the span of time as well.    Patient symptoms continue to worsen until EMS was contacted and patient was brought into [facility] emergency department for evaluation.  Upon evaluation in the emergency department patient is found to have leukocytosis of 11.9 with urinalysis suggestive of urinary tract infection with greater than 50 white blood cells per high-powered field.  Patient has been administered 2 L of lactated Ringer's boluses as well as 2 g of intravenous ceftriaxone.  The hospitalist group is now been called to assess the patient for admission to the hospital.  Review of Systems: Review of Systems  Unable to perform ROS: Dementia    Past Medical History:  Diagnosis Date   Cardiomegaly    /notes 02/17/2013   Gastric ulcer    High cholesterol    History of blood transfusion    Hypertension    UTI (lower urinary tract infection)     Past Surgical History:  Procedure Laterality Date   ESOPHAGOGASTRODUODENOSCOPY  ENDOSCOPY     KYPHOPLASTY N/A 01/13/2014   Procedure: Lumbar one KYPHOPLASTY;  Surgeon: Barnett Abu, MD;  Location: [facility]UR[facility] ORS;  Service: Neurosurgery;  Laterality: N/A;  L1 KYPHOPLASTY   NO PAST SURGERIES      Social History:  reports that she has been smoking cigarettes. She has a 7.20 pack-year smoking history. She has never used smokeless tobacco. She reports that she does not drink alcohol and does not use drugs.  No Known Allergies  No family history on file.  Prior to Admission medications   Medication Sig Start Date End Date Taking? Authorizing Provider  acetaminophen (TYLENOL) 500 MG tablet Take 500 mg by mouth See admin instructions. Take 500 mg by mouth in the morning and an additional 500 mg once a day as needed for pain    [provider]  alendronate (FOSAMAX) 70 MG tablet Take 70 mg by mouth every Wednesday.  01/12/14   [provider]  atenolol (TENORMIN) 100 MG tablet Take 100 mg by mouth at bedtime.     [provider]  B Complex Vitamins (B COMPLEX PO) Take 1 tablet by mouth daily.    [provider]  Calcium Carbonate (CALCIUM 600 PO) Take 1,200 mg by mouth daily.    [provider]  Cholecalciferol (VITAMIN D-3) 25 MCG (1000 UT) CAPS Take 1,000 Units by mouth at bedtime.    [provider]  docusate sodium (COLACE) 100 MG capsule Take 100 mg by mouth at bedtime.     [provider]  Famotidine (PEPCID AC MAXIMUM STRENGTH) 20 MG CHEW Chew 20  mg by mouth at bedtime.    [provider]  lisinopril (ZESTRIL) 10 MG tablet Take 10 mg by mouth at bedtime.     [provider]  nitrofurantoin, macrocrystal-monohydrate, (MACROBID) 100 MG capsule Take 1 capsule by mouth at bedtime. 01/07/20   [provider]  traMADol (ULTRAM) 50 MG tablet Take 50 mg by mouth See admin instructions. Take 50 mg by mouth in the morning and an additional 50 mg once a day as needed for pain 09/19/19   [provider]  triamterene-hydrochlorothiazide (MAXZIDE) 75-50 MG per tablet Take 0.5 tablets by mouth daily. Patient not taking: Reported on 11/17/2019 01/19/14   Leroy Sea, MD  Vitamin A 2400 MCG (8000 UT) TABS Take 2,400 mcg by mouth daily.    [provider]    Physical Exam:  Vitals:   03/09/22 2227 03/09/22 2243 03/09/22 2252 03/10/22 0215  BP: (!) 161/79   (!) 187/90  Pulse:  80  90  Resp: 18   18  Temp: 99.2 F (37.3 C)   99 F (37.2 C)  TempSrc: Oral   Oral  SpO2:  98%  99%  Weight:   50 kg     Constitutional: Lethargic but arousable and oriented x 2, no associated distress.   Skin: no rashes, no lesions, poor skin turgor noted. Eyes: Pupils are equally reactive to light.  No evidence of scleral icterus or conjunctival pallor.  ENMT: Dry mucous membranes noted.  Posterior pharynx clear of any exudate or lesions.   Neck: normal, supple, no masses, no thyromegaly.  No evidence of jugular venous distension.   Respiratory: clear to auscultation bilaterally, no wheezing, no crackles. Normal respiratory effort. No accessory muscle use.  Cardiovascular: Regular rate and rhythm, no murmurs / rubs / gallops. No extremity edema. 2+ pedal pulses. No carotid bruits.  Chest:   Nontender without crepitus or deformity.   Back:   Nontender without crepitus or deformity. Abdomen: Abdomen is soft and nontender.  No evidence of intra-abdominal masses.  Positive bowel sounds noted in all quadrants.   Musculoskeletal: No joint deformity upper and lower extremities. Good ROM, no contractures. Normal muscle tone.  Neurologic: CN 2-12 grossly intact. Sensation intact.  Patient moving all 4 extremities spontaneously.  Patient is following all commands.  Patient is responsive to verbal stimuli.   Psychiatric: Patient exhibits normal mood with appropriate affect.  Patient seems to possess insight as to their current situation.    Data Reviewed:  I have personally reviewed and  interpreted labs, imaging.  Significant findings are:  Urinalysis revealing large leukocytes with greater than 50 white blood cells per high-powered field and rare bacteria.   CBC revealing white blood cell count of 11.9 with hemoglobin 12.7 and platelet count of 179.   Chemistry revealing sodium 136 potassium 3.5 glucose 131 creatinine 0.71.   Magnesium 1.9.   Troponin 6.   Chest x-ray personally reviewed revealing no evidence of acute acute cardiopulmonary disease.   CT imaging of the head without contrast revealing no evidence of acute intracranial abnormality.  EKG: Personally reviewed.  Rhythm is NSR with heart rate of 86BPM. RBBB.  No dynamic ST segment changes appreciated.   Assessment and Plan: * Acute cystitis without hematuria Patient presenting with 2-week history of dysuria weakness and what seems to be concurrent development of confusion and lethargy Urinalysis suggestive of urinary tract infection which is likely causing some degree of metabolic encephalopathy superimposed on patient's known history of dementia Treating  patient with intravenous ceftriaxone Urine cultures pending, blood cultures ordered Patient is already received 2 L of intravenous lactated Ringer's solution here in the emergency department  Acute metabolic encephalopathy Acute metabolic encephalopathy likely secondary to underlying infection and some degree of volume depletion Patient has been hydrated generously here in the emergency department 2 L of lactated Ringer solution Patient is additionally been placed on intravenous ceftriaxone for underlying infection Encephalopathy should be self-limiting We will monitor for resolution of encephalopathy, consider expanding work-up if it does not rapidly improve  Essential hypertension Resume patients home regimen of oral antihypertensives Titrate antihypertensive regimen as necessary to achieve adequate BP control PRN intravenous antihypertensives for  excessively elevated blood pressure    Mixed hyperlipidemia Documented history of hyperlipidemia however there are no drugs on the patient's home medications Attending to confirm home meds   Dementia without behavioral disturbance (HCC) Longstanding known history of dementia and cognitive deficit Minimizing uncomfortable stimuli Minimizing mood altering and sedating agents Encouraging family to remain at bedside is much as possible Frequent redirection by staff Fall precautions   GERD without esophagitis Placing on daily PPI.       Code Status:  Full code      Consults: None  Severity of Illness:  The appropriate patient status for this patient is INPATIENT. Inpatient status is judged to be reasonable and necessary in order to provide the required intensity of service to ensure the patient's safety. The patient's presenting symptoms, physical exam findings, and initial radiographic and laboratory data in the context of their chronic comorbidities is felt to place them at high risk for further clinical deterioration. Furthermore, it is not anticipated that the patient will be medically stable for discharge from the hospital within 2 midnights of admission.   * I certify that at the point of admission it is my clinical judgment that the patient will require inpatient hospital care spanning beyond 2 midnights from the point of admission due to high intensity of service, high risk for further deterioration and high frequency of surveillance required.*  Author:  Marinda ElkGeorge J Maximos Zayas MD  03/10/2022 7:47 AM

## 2022-03-10 NOTE — Assessment & Plan Note (Signed)
   Longstanding known history of dementia and cognitive deficit  Minimizing uncomfortable stimuli  Minimizing mood altering and sedating agents  Encouraging family to remain at bedside is much as possible  Frequent redirection by staff  Fall precautions  

## 2022-03-10 NOTE — Assessment & Plan Note (Signed)
   Placing on daily PPI.

## 2022-03-10 NOTE — Progress Notes (Signed)
PROGRESS NOTE  [name]  DOB: 1930/04/14  PCP: Kaleen Mask, MD QQV:[id number]  DOA: 03/09/2022  LOS: 0 days  Hospital Day: 2  Brief narrative: [name] is a 88 y.o. female with PMH significant for dementia, HTN, HLD, GERD, osteoporosis, gastric ulcer. Patient presented to ED on 5/19 with complaint of dysuria, progressively worsening for 2 weeks associated with generalized weakness, confusion. In the ED, patient had temperature of 98.2, blood pressure 161/79 Lab with WC count 11.9 otherwise unremarkable, urinalysis with hazy yellow urine with moderate amount of hemoglobin, 20 ketones, large leukocytes and rare bacteria Cultures sent Patient started on IV fluid, IV Rocephin. Admitted to hospitalist service.   Subjective: Patient was seen and examined this morning.  Pleasant elderly female.  Not in distress.  Alert, awake, able to tell me her birthday but not oriented otherwise.  Cheerful though.  No family at bedside Chart reviewed Blood pressure 180s this morning  Principal Problem:   Acute cystitis without hematuria Active Problems:   Acute metabolic encephalopathy   Essential hypertension   Mixed hyperlipidemia   Dementia without behavioral disturbance (HCC)   GERD without esophagitis    Assessment and Plan: Acute cystitis without hematuria -Presented with progressively worsening dysuria, generalized weakness, confusion for 2 weeks  -Likely secondary to UTI as suggested by urinalysis  -Culture sent.  Continue IV Rocephin and IV fluid.   Recent Labs  Lab 03/10/22 0020 03/10/22 0901  WBC 11.9* 10.6*   Acute metabolic encephalopathy Dementia -Delirium precipitated by UTI in the setting of dementia  -Expect improvement with UTI treatment  -Continue to monitor mental status -Fall precautions, delirium precautions, minimize use of sedatives   Essential hypertension -Home meds include atenolol 100 mg daily, lisinopril 10 mg daily,   Mixed  hyperlipidemia -Continue statin    GERD -PPI.  Goals of care   Code Status: Full Code    Mobility: Encourage ambulation  Skin assessment:     Nutritional status:  Body mass index is 20.16 kg/m.          Diet:  Diet Order             Diet Heart Room service appropriate? Yes; Fluid consistency: Thin  Diet effective now                   DVT prophylaxis:  enoxaparin (LOVENOX) injection 30 mg Start: 03/10/22 1000   Antimicrobials: IV Rocephin Fluid: None Consultants: None Family Communication: None at bedside  Status is: Inpatient  Continue in-hospital care because: Needs IV antibiotics Level of care: Telemetry Medical   Dispo: The patient is from: Home              Anticipated d/c is to: Pending clinical course              Patient currently is not medically stable to d/c.   Difficult to place patient No     Infusions:   [START ON 03/11/2022] cefTRIAXone (ROCEPHIN)  IV      Scheduled Meds:  enoxaparin (LOVENOX) injection  30 mg Subcutaneous Daily   lisinopril  10 mg Oral QHS   pantoprazole  40 mg Oral Daily    PRN meds: acetaminophen **OR** acetaminophen, hydrALAZINE, ondansetron **OR** ondansetron (ZOFRAN) IV, polyethylene glycol   Antimicrobials: Anti-infectives (From admission, onward)    Start     Dose/Rate Route Frequency Ordered Stop   03/11/22 2200  cefTRIAXone (ROCEPHIN) 1 g in sodium chloride 0.9 % 100 mL IVPB  1 g 200 mL/hr over 30 Minutes Intravenous Every 24 hours 03/10/22 0739     03/10/22 0115  cefTRIAXone (ROCEPHIN) 2 g in sodium chloride 0.9 % 100 mL IVPB        2 g 200 mL/hr over 30 Minutes Intravenous  Once 03/10/22 0107 03/10/22 0216       Objective: Vitals:   03/10/22 0814 03/10/22 0942  BP: (!) 181/82 (!) 183/83  Pulse: 86 95  Resp: 16 13  Temp: (!) 97.4 F (36.3 C)   SpO2: 96% 99%    Intake/Output Summary (Last 24 hours) at 03/10/2022 1131 Last data filed at 03/10/2022 0647 Gross per 24 hour   Intake 2115.65 ml  Output --  Net 2115.65 ml   Filed Weights   03/09/22 2252  Weight: 50 kg   Weight change:  Body mass index is 20.16 kg/m.   Physical Exam: General exam: Pleasant, elderly female not in physical distress Skin: No rashes, lesions or ulcers. HEENT: Atraumatic, normocephalic, no obvious bleeding Lungs: Clear to auscultation bilaterally CVS: Regular rate and rhythm, no murmur GI/Abd soft, nontender, nondistended, bowel sound present CNS: Alert, awake, oriented to place only. Psychiatry: Cheerful Extremities: No pedal edema, no calf tenderness  Data Review: I have personally reviewed the laboratory data and studies available.  F/u labs ordered Unresulted Labs (From admission, onward)     Start     Ordered   03/10/22 0742  Culture, blood (Routine X 2) w Reflex to ID Panel  BLOOD CULTURE X 2,   R (with TIMED occurrences)      03/10/22 0741   03/10/22 0108  Urine Culture  Add-on,   AD       Question:  Indication  Answer:  Dysuria   03/10/22 0107            Signed, Lorin Glass, MD [facility] 03/10/2022

## 2022-03-11 DIAGNOSIS — N3000 Acute cystitis without hematuria: Secondary | ICD-10-CM | POA: Diagnosis not present

## 2022-03-11 LAB — URINE CULTURE

## 2022-03-11 MED ORDER — POTASSIUM CHLORIDE CRYS ER 20 MEQ PO TBCR
40.0000 meq | EXTENDED_RELEASE_TABLET | Freq: Once | ORAL | Status: AC
Start: 2022-03-11 — End: 2022-03-11
  Administered 2022-03-11: 40 meq via ORAL
  Filled 2022-03-11: qty 2

## 2022-03-11 NOTE — Evaluation (Signed)
Physical Therapy Evaluation  Patient Details Name: [name] MRN: [id number] DOB: 11/27/1929 Today's Date: 03/11/2022  History of Present Illness  Pt is a 88 y/o female who presents to the ED 03/09/22 with dysuria, progressively worsening for 2 weeks associated with generalized weakness, confusion. PMH significant for cardiomegaly, kyphoplasty 2015, dementia, HTN, osteoporosis.   Clinical Impression  Pt admitted with above diagnosis. Pt currently with functional limitations due to the deficits listed below (see PT Problem List). At the time of PT eval pt was able to transition OOB to chair with up to max assist for balance support and safety. The pt gave me permission to call her son, Onalee HuaDavid (who she lives with) to discuss discharge. He is motivated for pt to return home and states he is willing to provide heavier assist for her as needed. Onalee HuaDavid states she was fairly independent at home PTA. Will progress mobility as able while admitted. Requested OT eval to address ADL's. Pt will benefit from skilled PT to increase their independence and safety with mobility to allow discharge to the venue listed below.       Recommendations for follow up therapy are one component of a multi-disciplinary discharge planning process, led by the attending physician.  Recommendations may be updated based on patient status, additional functional criteria and insurance authorization.  Follow Up Recommendations Home health PT (Per family's request)    Assistance Recommended at Discharge Frequent or constant Supervision/Assistance  Patient can return home with the following  A lot of help with walking and/or transfers;A lot of help with bathing/dressing/bathroom;Assistance with cooking/housework;Direct supervision/assist for medications management;Direct supervision/assist for financial management;Assist for transportation;Help with stairs or ramp for entrance    Equipment Recommendations BSC/3in1  Recommendations for  Other Services       Functional Status Assessment Patient has had a recent decline in their functional status and demonstrates the ability to make significant improvements in function in a reasonable and predictable amount of time.     Precautions / Restrictions Precautions Precautions: Fall Restrictions Weight Bearing Restrictions: No      Mobility  Bed Mobility Overal bed mobility: Needs Assistance Bed Mobility: Supine to Sit     Supine to sit: Max assist     General bed mobility comments: Bed pad utilized to scoot around to EOB and assist for trunk elevation to full sitting position. Once up, noted almost immediate R lateral lean with difficulty to correct.    Transfers Overall transfer level: Needs assistance Equipment used: 1 person hand held assist Transfers: Sit to/from Stand, Bed to chair/wheelchair/BSC Sit to Stand: Max assist   Step pivot transfers: Max assist       General transfer comment: Heavy assist to transition to standing, take a few side steps towards HOB, and then after seated rest break, SPT to the chair. Pt with difficulty advancing feet around and therapist facilitated weight shift throughout transfer.    Ambulation/Gait         Gait velocity: Decreased     General Gait Details: Unable to progress to gait training this date.  Stairs            Wheelchair Mobility    Modified Rankin (Stroke Patients Only)       Balance  Pertinent Vitals/Pain Pain Assessment Pain Assessment: No/denies pain    Home Living Family/patient expects to be discharged to:: Private residence Living Arrangements: Children (Son) Available Help at Discharge: Family;Available 24 hours/day Type of Home: House Home Access: Stairs to enter   [employer] of Steps: 1   Home Layout: One level Home Equipment: Grab bars - tub/shower;Rolling Walker (2 wheels)      Prior Function  Prior Level of Function : Independent/Modified Independent             Mobility Comments: Per son pt adamantly refuses using a RW ADLs Comments: Son reports pt has been washing up at the sink for "years" and she refuses to let him help her bathe, however she will allow her friend to come over and get her in the bath tub occasionally.     Hand Dominance   Dominant Hand: Right    Extremity/Trunk Assessment   Upper Extremity Assessment Upper Extremity Assessment: Generalized weakness    Lower Extremity Assessment Lower Extremity Assessment: Generalized weakness    Cervical / Trunk Assessment Cervical / Trunk Assessment: Kyphotic (Forward head posture with rounded shoulders.)  Communication   Communication: No difficulties  Cognition Arousal/Alertness: Awake/alert Behavior During Therapy: Restless, Impulsive Overall Cognitive Status: History of cognitive impairments - at baseline                                 General Comments: Dementia at baseline        General Comments      Exercises     Assessment/Plan    PT Assessment Patient needs continued PT services  PT Problem List Decreased strength;Decreased activity tolerance;Decreased balance;Decreased mobility;Decreased knowledge of use of DME;Decreased safety awareness;Decreased knowledge of precautions;Decreased cognition       PT Treatment Interventions DME instruction;Gait training;Functional mobility training;Therapeutic activities;Therapeutic exercise;Balance training;Patient/family education    PT Goals (Current goals can be found in the Care Plan section)  Acute Rehab PT Goals Patient Stated Goal: Pt did not state goals PT Goal Formulation: With patient/family Time For Goal Achievement: 03/25/22 Potential to Achieve Goals: Fair    Frequency Min 3X/week     Co-evaluation               AM-PAC PT "6 Clicks" Mobility  Outcome Measure Help needed turning from your back to your side  while in a flat bed without using bedrails?: A Little Help needed moving from lying on your back to sitting on the side of a flat bed without using bedrails?: A Lot Help needed moving to and from a bed to a chair (including a wheelchair)?: A Lot Help needed standing up from a chair using your arms (e.g., wheelchair or bedside chair)?: A Lot Help needed to walk in hospital room?: Total Help needed climbing 3-5 steps with a railing? : Total 6 Click Score: 11    End of Session Equipment Utilized During Treatment: Gait belt Activity Tolerance: Patient limited by fatigue (weakness) Patient left: in chair;with call bell/phone within reach;with chair alarm set Nurse Communication: Mobility status PT Visit Diagnosis: Muscle weakness (generalized) (M62.81);Difficulty in walking, not elsewhere classified (R26.2)    Time: 5320-2334 PT Time Calculation (min) (ACUTE ONLY): 27 min   Charges:   PT Evaluation $PT Eval Moderate Complexity: 1 Mod PT Treatments $Gait Training: 8-22 mins        Conni Slipper, PT, DPT Acute Rehabilitation Services Secure Chat Preferred Office: [phone]  Marylynn Pearson 03/11/2022, 3:19 PM

## 2022-03-11 NOTE — TOC Progression Note (Addendum)
Transition of Care [facility]) - Progression Note    Patient Details  Name: [name] MRN: [id number] Date of Birth: 30-Jun-1930  Transition of Care [facility]) CM/SW Contact  Bartholomew Crews, RN Phone Number: [phone] 03/11/2022, 3:24 PM  Clinical Narrative:     Notified by PT that patient's son desires for patient to return home despite her increased weakness and limited mobility.   Voicemail left for patient's son, [name], to discus post acute transition. RNCM contact number provided.       Expected Discharge Plan and Services                                                 Social Determinants of Health (SDOH) Interventions    Readmission Risk Interventions     View : No data to display.

## 2022-03-11 NOTE — Progress Notes (Signed)
PROGRESS NOTE  Shaunte Tuft  DOB: Jul 25, 1930  PCP: Kaleen Mask, MD [id number]  DOA: 03/09/2022  LOS: 1 day  Hospital Day: 3  Brief narrative: [name] is a 88 y.o. female with PMH significant for dementia, HTN, HLD, GERD, osteoporosis, gastric ulcer. Patient presented to ED on 5/19 with complaint of dysuria, progressively worsening for 2 weeks associated with generalized weakness, confusion. In the ED, patient had temperature of 98.2, blood pressure 161/79 Lab with WC count 11.9 otherwise unremarkable, urinalysis with hazy yellow urine with moderate amount of hemoglobin, 20 ketones, large leukocytes and rare bacteria Cultures sent Patient started on IV fluid, IV Rocephin. Admitted to hospitalist service.   Subjective: Patient was seen and examined this morning. Lying down in bed.  Not in distress.  Son and daughter at bedside.  Patient physically feels weak but mentally seems to be close to her baseline per daughter.  Principal Problem:   Acute cystitis without hematuria Active Problems:   Acute metabolic encephalopathy   Essential hypertension   Mixed hyperlipidemia   Dementia without behavioral disturbance (HCC)   GERD without esophagitis    Assessment and Plan: Acute cystitis without hematuria -Presented with progressively worsening dysuria, generalized weakness, confusion for 2 weeks  -Likely secondary to UTI as suggested by urinalysis  -Urine culture report pending.  Currently clinically gradually improving on IV Rocephin and IV fluid.  Continue the same. Recent Labs  Lab 03/10/22 0020 03/10/22 0901  WBC 11.9* 10.6*   Acute metabolic encephalopathy Dementia -Delirium precipitated by UTI in the setting of dementia  -Expect improvement with UTI treatment  -Continue to monitor mental status -Fall precautions, delirium precautions, minimize use of sedatives  Hypokalemia -Potassium low at 3 today.  Replacement given.  Continue to monitor Recent Labs   Lab 03/10/22 0020 03/10/22 0901  K 3.5 3.0*  MG 1.9 1.8    Essential hypertension -Continue lisinopril as before.  Mixed hyperlipidemia -Continue statin    GERD -PPI.  Goals of care   Code Status: Full Code    Mobility: Encourage ambulation  Skin assessment:     Nutritional status:  Body mass index is 15.4 kg/m.          Diet:  Diet Order             Diet Heart Room service appropriate? Yes; Fluid consistency: Thin  Diet effective now                   DVT prophylaxis:  enoxaparin (LOVENOX) injection 30 mg Start: 03/10/22 1000   Antimicrobials: IV Rocephin Fluid: None Consultants: None Family Communication: None at bedside  Status is: Inpatient  Continue in-hospital care because: Continue antibiotics IV, pending PT eval Level of care: Telemetry Medical   Dispo: The patient is from: Home              Anticipated d/c is to: Pending clinical course hopefully home with PT tomorrow.              Patient currently is not medically stable to d/c.   Difficult to place patient No     Infusions:   cefTRIAXone (ROCEPHIN)  IV      Scheduled Meds:  enoxaparin (LOVENOX) injection  30 mg Subcutaneous Daily   lisinopril  10 mg Oral QHS   pantoprazole  40 mg Oral Daily    PRN meds: acetaminophen **OR** acetaminophen, hydrALAZINE, ondansetron **OR** ondansetron (ZOFRAN) IV, polyethylene glycol   Antimicrobials: Anti-infectives (From admission, onward)  Start     Dose/Rate Route Frequency Ordered Stop   03/11/22 2200  cefTRIAXone (ROCEPHIN) 1 g in sodium chloride 0.9 % 100 mL IVPB        1 g 200 mL/hr over 30 Minutes Intravenous Every 24 hours 03/10/22 0739     03/10/22 0115  cefTRIAXone (ROCEPHIN) 2 g in sodium chloride 0.9 % 100 mL IVPB        2 g 200 mL/hr over 30 Minutes Intravenous  Once 03/10/22 0107 03/10/22 0216       Objective: Vitals:   03/11/22 0439 03/11/22 0900  BP: 131/77 (!) 159/65  Pulse: 98 98  Resp:  18  Temp: 97.6  F (36.4 C) 98 F (36.7 C)  SpO2: 99% 99%   No intake or output data in the 24 hours ending 03/11/22 1348  Filed Weights   03/09/22 2252 03/10/22 1558  Weight: 50 kg 38.2 kg   Weight change: -11.8 kg Body mass index is 15.4 kg/m.   Physical Exam: General exam: Pleasant, elderly female not in physical distress Skin: No rashes, lesions or ulcers. HEENT: Atraumatic, normocephalic, no obvious bleeding Lungs: Clear to auscultation bilaterally CVS: Regular rate and rhythm, no murmur GI/Abd soft, nontender, nondistended, bowel sound present CNS: Alert, awake, oriented to place and person.  Not oriented to time at baseline. Psychiatry: Cheerful Extremities: No pedal edema, no calf tenderness  Data Review: I have personally reviewed the laboratory data and studies available.  F/u labs ordered Unresulted Labs (From admission, onward)     Start     Ordered   03/12/22 0500  CBC with Differential/Platelet  Tomorrow morning,   R        03/11/22 0738   03/12/22 0500  Basic metabolic panel  Tomorrow morning,   R        03/11/22 0738            Signed, Lorin Glass, MD [facility] 03/11/2022

## 2022-03-12 ENCOUNTER — Emergency Department (HOSPITAL_COMMUNITY): Payer: Medicare Other

## 2022-03-12 ENCOUNTER — Observation Stay (HOSPITAL_COMMUNITY)
Admission: EM | Admit: 2022-03-12 | Discharge: 2022-03-15 | Disposition: A | Discharge status: Skilled Nursing Facility | Payer: Medicare Other | Attending: Family Medicine | Admitting: Family Medicine

## 2022-03-12 ENCOUNTER — Encounter (HOSPITAL_COMMUNITY): Payer: Self-pay | Admitting: Family Medicine

## 2022-03-12 DIAGNOSIS — Z79899 Other long term (current) drug therapy: Secondary | ICD-10-CM | POA: Insufficient documentation

## 2022-03-12 DIAGNOSIS — N1330 Unspecified hydronephrosis: Secondary | ICD-10-CM | POA: Insufficient documentation

## 2022-03-12 DIAGNOSIS — K219 Gastro-esophageal reflux disease without esophagitis: Secondary | ICD-10-CM | POA: Diagnosis present

## 2022-03-12 DIAGNOSIS — R6510 Systemic inflammatory response syndrome (SIRS) of non-infectious origin without acute organ dysfunction: Secondary | ICD-10-CM | POA: Insufficient documentation

## 2022-03-12 DIAGNOSIS — E876 Hypokalemia: Secondary | ICD-10-CM | POA: Diagnosis not present

## 2022-03-12 DIAGNOSIS — N858 Other specified noninflammatory disorders of uterus: Secondary | ICD-10-CM | POA: Diagnosis present

## 2022-03-12 DIAGNOSIS — F0390 Unspecified dementia without behavioral disturbance: Secondary | ICD-10-CM | POA: Insufficient documentation

## 2022-03-12 DIAGNOSIS — M6281 Muscle weakness (generalized): Secondary | ICD-10-CM | POA: Diagnosis not present

## 2022-03-12 DIAGNOSIS — R000 Tachycardia, unspecified: Secondary | ICD-10-CM | POA: Insufficient documentation

## 2022-03-12 DIAGNOSIS — R531 Weakness: Secondary | ICD-10-CM | POA: Insufficient documentation

## 2022-03-12 DIAGNOSIS — R5381 Other malaise: Secondary | ICD-10-CM | POA: Diagnosis present

## 2022-03-12 DIAGNOSIS — I10 Essential (primary) hypertension: Secondary | ICD-10-CM | POA: Diagnosis not present

## 2022-03-12 DIAGNOSIS — E782 Mixed hyperlipidemia: Secondary | ICD-10-CM | POA: Insufficient documentation

## 2022-03-12 DIAGNOSIS — F17210 Nicotine dependence, cigarettes, uncomplicated: Secondary | ICD-10-CM | POA: Insufficient documentation

## 2022-03-12 DIAGNOSIS — A419 Sepsis, unspecified organism: Secondary | ICD-10-CM

## 2022-03-12 DIAGNOSIS — G9340 Encephalopathy, unspecified: Secondary | ICD-10-CM | POA: Insufficient documentation

## 2022-03-12 DIAGNOSIS — N134 Hydroureter: Secondary | ICD-10-CM | POA: Insufficient documentation

## 2022-03-12 DIAGNOSIS — Z723 Lack of physical exercise: Secondary | ICD-10-CM | POA: Insufficient documentation

## 2022-03-12 DIAGNOSIS — N390 Urinary tract infection, site not specified: Secondary | ICD-10-CM | POA: Insufficient documentation

## 2022-03-12 LAB — BASIC METABOLIC PANEL
Anion gap: 9 (ref 5–15)
BUN: 21 mg/dL (ref 8–23)
CO2: 24 mmol/L (ref 22–32)
Calcium: 9.1 mg/dL (ref 8.9–10.3)
Chloride: 103 mmol/L (ref 98–111)
Creatinine, Ser: 0.81 mg/dL (ref 0.44–1.00)
GFR, Estimated: >60 mL/min (ref >60)
Glucose, Bld: 135 mg/dL — ABNORMAL HIGH (ref 70–99)
Potassium: 3.5 mmol/L (ref 3.5–5.1)
Sodium: 136 mmol/L (ref 135–145)

## 2022-03-12 LAB — COMPREHENSIVE METABOLIC PANEL
ALT: 11 U/L (ref 0–44)
AST: 15 U/L (ref 15–41)
Albumin: 3.4 g/dL — ABNORMAL LOW (ref 3.5–5.0)
Alkaline Phosphatase: 72 U/L (ref 38–126)
Anion gap: 9 (ref 5–15)
BUN: 25 mg/dL — ABNORMAL HIGH (ref 8–23)
CO2: 26 mmol/L (ref 22–32)
Calcium: 9.2 mg/dL (ref 8.9–10.3)
Chloride: 102 mmol/L (ref 98–111)
Creatinine, Ser: 0.85 mg/dL (ref 0.44–1.00)
GFR, Estimated: >60 mL/min (ref >60)
Glucose, Bld: 175 mg/dL — ABNORMAL HIGH (ref 70–99)
Potassium: 3.4 mmol/L — ABNORMAL LOW (ref 3.5–5.1)
Sodium: 137 mmol/L (ref 135–145)
Total Bilirubin: 1.1 mg/dL (ref 0.3–1.2)
Total Protein: 7.6 g/dL (ref 6.5–8.1)

## 2022-03-12 LAB — CBC WITH DIFFERENTIAL/PLATELET
Abs Immature Granulocytes: 0.05 10*3/uL (ref 0.00–0.07)
Abs Immature Granulocytes: 0.09 10*3/uL — ABNORMAL HIGH (ref 0.00–0.07)
Basophils Absolute: 0 10*3/uL (ref 0.0–0.1)
Basophils Absolute: 0 10*3/uL (ref 0.0–0.1)
Basophils Relative: 0 %
Basophils Relative: 0 %
Eosinophils Absolute: 0 10*3/uL (ref 0.0–0.5)
Eosinophils Absolute: 0 10*3/uL (ref 0.0–0.5)
Eosinophils Relative: 0 %
Eosinophils Relative: 0 %
HCT: 36.4 % (ref 36.0–46.0)
HCT: 34.4 % — ABNORMAL LOW (ref 36.0–46.0)
Hemoglobin: 12.4 g/dL (ref 12.0–15.0)
Hemoglobin: 11.5 g/dL — ABNORMAL LOW (ref 12.0–15.0)
Immature Granulocytes: 0 %
Immature Granulocytes: 1 %
Lymphocytes Relative: 8 %
Lymphocytes Relative: 2 %
Lymphs Abs: 1 10*3/uL (ref 0.7–4.0)
Lymphs Abs: 0.3 10*3/uL — ABNORMAL LOW (ref 0.7–4.0)
MCH: 30.5 pg (ref 26.0–34.0)
MCH: 30.3 pg (ref 26.0–34.0)
MCHC: 34.1 g/dL (ref 30.0–36.0)
MCHC: 33.4 g/dL (ref 30.0–36.0)
MCV: 89.4 fL (ref 80.0–100.0)
MCV: 90.5 fL (ref 80.0–100.0)
Monocytes Absolute: 1.2 10*3/uL — ABNORMAL HIGH (ref 0.1–1.0)
Monocytes Absolute: 1 10*3/uL (ref 0.1–1.0)
Monocytes Relative: 10 %
Monocytes Relative: 7 %
Neutro Abs: 10.3 10*3/uL — ABNORMAL HIGH (ref 1.7–7.7)
Neutro Abs: 13.9 10*3/uL — ABNORMAL HIGH (ref 1.7–7.7)
Neutrophils Relative %: 82 %
Neutrophils Relative %: 90 %
Platelets: 195 10*3/uL (ref 150–400)
Platelets: 211 10*3/uL (ref 150–400)
RBC: 4.07 MIL/uL (ref 3.87–5.11)
RBC: 3.8 MIL/uL — ABNORMAL LOW (ref 3.87–5.11)
RDW: 12.6 % (ref 11.5–15.5)
RDW: 12.7 % (ref 11.5–15.5)
WBC: 12.6 10*3/uL — ABNORMAL HIGH (ref 4.0–10.5)
WBC: 15.3 10*3/uL — ABNORMAL HIGH (ref 4.0–10.5)
nRBC: 0 % (ref 0.0–0.2)
nRBC: 0 % (ref 0.0–0.2)

## 2022-03-12 LAB — URINALYSIS, ROUTINE W REFLEX MICROSCOPIC
Bilirubin Urine: NEGATIVE
Glucose, UA: 50 mg/dL — AB
Ketones, ur: 5 mg/dL — AB
Leukocytes,Ua: NEGATIVE
Nitrite: NEGATIVE
Protein, ur: 30 mg/dL — AB
Specific Gravity, Urine: 1.026 (ref 1.005–1.030)
pH: 6 (ref 5.0–8.0)

## 2022-03-12 LAB — TROPONIN I (HIGH SENSITIVITY): Troponin I (High Sensitivity): 4 ng/L (ref <18)

## 2022-03-12 LAB — MAGNESIUM: Magnesium: 2.1 mg/dL (ref 1.7–2.4)

## 2022-03-12 LAB — LACTIC ACID, PLASMA: Lactic Acid, Venous: 1 mmol/L (ref 0.5–1.9)

## 2022-03-12 LAB — T4, FREE: Free T4: 1.64 ng/dL — ABNORMAL HIGH (ref 0.61–1.12)

## 2022-03-12 LAB — TSH: TSH: 0.326 u[IU]/mL — ABNORMAL LOW (ref 0.350–4.500)

## 2022-03-12 MED ORDER — CEPHALEXIN 500 MG PO CAPS
500.0000 mg | ORAL_CAPSULE | Freq: Three times a day (TID) | ORAL | Status: DC
  Administered 2022-03-12: 500 mg via ORAL
  Filled 2022-03-12: qty 1

## 2022-03-12 MED ORDER — LISINOPRIL 10 MG PO TABS
10.0000 mg | ORAL_TABLET | Freq: Every day | ORAL | Status: DC
  Administered 2022-03-12 – 2022-03-14 (×3): 10 mg via ORAL
  Filled 2022-03-12 (×4): qty 1

## 2022-03-12 MED ORDER — LACTATED RINGERS IV SOLN: INTRAVENOUS | Status: DC

## 2022-03-12 MED ORDER — SENNOSIDES-DOCUSATE SODIUM 8.6-50 MG PO TABS: 1.0000 | ORAL_TABLET | Freq: Every evening | ORAL | Status: DC | PRN

## 2022-03-12 MED ORDER — METRONIDAZOLE 500 MG/100ML IV SOLN
500.0000 mg | Freq: Once | INTRAVENOUS | Status: AC
  Administered 2022-03-12: 500 mg via INTRAVENOUS
  Filled 2022-03-12: qty 100

## 2022-03-12 MED ORDER — POTASSIUM CHLORIDE CRYS ER 20 MEQ PO TBCR
20.0000 meq | EXTENDED_RELEASE_TABLET | Freq: Once | ORAL | Status: AC
  Administered 2022-03-12: 20 meq via ORAL
  Filled 2022-03-12: qty 1

## 2022-03-12 MED ORDER — VANCOMYCIN HCL IN DEXTROSE 1-5 GM/200ML-% IV SOLN: 1000.0000 mg | Freq: Once | INTRAVENOUS | Status: DC

## 2022-03-12 MED ORDER — ENSURE ENLIVE PO LIQD
237.0000 mL | Freq: Two times a day (BID) | ORAL | Status: DC
  Administered 2022-03-13 – 2022-03-15 (×6): 237 mL via ORAL

## 2022-03-12 MED ORDER — SODIUM CHLORIDE 0.9 % IV SOLN
2.0000 g | Freq: Once | INTRAVENOUS | Status: AC
  Administered 2022-03-12: 2 g via INTRAVENOUS
  Filled 2022-03-12 (×2): qty 12.5

## 2022-03-12 MED ORDER — TRAMADOL HCL 50 MG PO TABS
50.0000 mg | ORAL_TABLET | ORAL | Status: DC | PRN
  Administered 2022-03-15: 50 mg via ORAL
  Filled 2022-03-12: qty 1

## 2022-03-12 MED ORDER — LACTATED RINGERS IV BOLUS (SEPSIS)
1000.0000 mL | Freq: Once | INTRAVENOUS | Status: AC
  Administered 2022-03-12: 1000 mL via INTRAVENOUS

## 2022-03-12 MED ORDER — LACTATED RINGERS IV BOLUS (SEPSIS)
250.0000 mL | Freq: Once | INTRAVENOUS | Status: AC
  Administered 2022-03-12: 250 mL via INTRAVENOUS

## 2022-03-12 MED ORDER — CEPHALEXIN 500 MG PO CAPS: 500.0000 mg | ORAL_CAPSULE | Freq: Three times a day (TID) | ORAL | 0 refills | Status: DC

## 2022-03-12 MED ORDER — VANCOMYCIN HCL 750 MG/150ML IV SOLN
750.0000 mg | Freq: Once | INTRAVENOUS | Status: AC
  Administered 2022-03-12: 750 mg via INTRAVENOUS
  Filled 2022-03-12: qty 150

## 2022-03-12 MED ORDER — PANTOPRAZOLE SODIUM 40 MG PO TBEC
40.0000 mg | DELAYED_RELEASE_TABLET | Freq: Every day | ORAL | Status: DC
  Administered 2022-03-13 – 2022-03-15 (×3): 40 mg via ORAL
  Filled 2022-03-12 (×3): qty 1

## 2022-03-12 MED ORDER — ACETAMINOPHEN 650 MG RE SUPP: 650.0000 mg | Freq: Four times a day (QID) | RECTAL | Status: DC | PRN

## 2022-03-12 MED ORDER — ACETAMINOPHEN 325 MG PO TABS
650.0000 mg | ORAL_TABLET | Freq: Four times a day (QID) | ORAL | Status: DC | PRN
  Administered 2022-03-15: 650 mg via ORAL
  Filled 2022-03-12: qty 2

## 2022-03-12 MED ORDER — IOHEXOL 300 MG/ML  SOLN
65.0000 mL | Freq: Once | INTRAMUSCULAR | Status: AC | PRN
  Administered 2022-03-12: 65 mL via INTRAVENOUS

## 2022-03-12 MED ORDER — SODIUM CHLORIDE 0.9 % IV SOLN
2.0000 g | INTRAVENOUS | Status: DC
  Administered 2022-03-13: 2 g via INTRAVENOUS
  Filled 2022-03-12: qty 12.5

## 2022-03-12 MED ORDER — ENOXAPARIN SODIUM 30 MG/0.3ML IJ SOSY
30.0000 mg | PREFILLED_SYRINGE | INTRAMUSCULAR | Status: DC
  Administered 2022-03-13 – 2022-03-15 (×3): 30 mg via SUBCUTANEOUS
  Filled 2022-03-12 (×3): qty 0.3

## 2022-03-12 NOTE — ED Notes (Signed)
Pt's maxipime was activated but plastic was blocking saline so unable to activate. Pharm called to send another dose.

## 2022-03-12 NOTE — Progress Notes (Signed)
Physical Therapy Treatment Patient Details Name: [name] MRN: [id number] DOB: 1930-05-21 Today's Date: 03/12/2022   History of Present Illness Pt is a 88 y/o female who presents to the ED 03/09/22 with dysuria, progressively worsening for 2 weeks associated with generalized weakness, confusion. PMH significant for cardiomegaly, kyphoplasty 2015, dementia, HTN, osteoporosis.    PT Comments    Pt continues to require heavy assist for all aspects of mobility at this time. No family present for education. Pt up in chair when PT arrived and she was likely fatigued as she had been sitting up for several hours since OT evaluation this morning. Continue to recommend follow up therapies for max safety and to decrease risk for falls. Will continue to follow.    Recommendations for follow up therapy are one component of a multi-disciplinary discharge planning process, led by the attending physician.  Recommendations may be updated based on patient status, additional functional criteria and insurance authorization.  Follow Up Recommendations  Home health PT (Per family's request)     Assistance Recommended at Discharge Frequent or constant Supervision/Assistance  Patient can return home with the following A lot of help with walking and/or transfers;A lot of help with bathing/dressing/bathroom;Assistance with cooking/housework;Direct supervision/assist for medications management;Direct supervision/assist for financial management;Assist for transportation;Help with stairs or ramp for entrance   Equipment Recommendations  BSC/3in1    Recommendations for Other Services       Precautions / Restrictions Precautions Precautions: Fall Restrictions Weight Bearing Restrictions: No     Mobility  Bed Mobility Overal bed mobility: Needs Assistance Bed Mobility: Sit to Supine       Sit to supine: Min assist   General bed mobility comments: Assist to elevate LE's back up onto bed at end of session     Transfers Overall transfer level: Needs assistance Equipment used: Rolling walker (2 wheels) Transfers: Sit to/from Stand, Bed to chair/wheelchair/BSC Sit to Stand: Max assist, +2 physical assistance Stand pivot transfers: Max assist, +2 physical assistance         General transfer comment: Attempted to stand with RW but pt unable to maintain standing balance, with heavy posterior lean. Noted pt soiled with urine and stool so returned to chair to prepare for SPT back to bed for clean up and gown change.    Ambulation/Gait               General Gait Details: Unable to progress to gait training this date.   Stairs             Wheelchair Mobility    Modified Rankin (Stroke Patients Only)       Balance Overall balance assessment: Needs assistance Sitting-balance support: No upper extremity supported, Feet supported Sitting balance-Leahy Scale: Fair     Standing balance support: Bilateral upper extremity supported, During functional activity Standing balance-Leahy Scale: Zero Standing balance comment: +2 required                            Cognition Arousal/Alertness: Awake/alert Behavior During Therapy: Flat affect, WFL for tasks assessed/performed Overall Cognitive Status: History of cognitive impairments - at baseline                                 General Comments: dementia at baseline        Exercises      General Comments  Pertinent Vitals/Pain Pain Assessment Pain Assessment: No/denies pain    Home Living                          Prior Function            PT Goals (current goals can now be found in the care plan section) Acute Rehab PT Goals Patient Stated Goal: Home today PT Goal Formulation: With patient/family Time For Goal Achievement: 03/25/22 Potential to Achieve Goals: Fair Progress towards PT goals: Progressing toward goals    Frequency    Min 3X/week      PT Plan  Current plan remains appropriate    Co-evaluation              AM-PAC PT "6 Clicks" Mobility   Outcome Measure  Help needed turning from your back to your side while in a flat bed without using bedrails?: A Little Help needed moving from lying on your back to sitting on the side of a flat bed without using bedrails?: A Lot Help needed moving to and from a bed to a chair (including a wheelchair)?: Total Help needed standing up from a chair using your arms (e.g., wheelchair or bedside chair)?: A Lot Help needed to walk in hospital room?: Total Help needed climbing 3-5 steps with a railing? : Total 6 Click Score: 10    End of Session Equipment Utilized During Treatment: Gait belt Activity Tolerance: Patient limited by fatigue (weakness) Patient left: in bed;with call bell/phone within reach;with bed alarm set Nurse Communication: Mobility status PT Visit Diagnosis: Muscle weakness (generalized) (M62.81);Difficulty in walking, not elsewhere classified (R26.2)     Time: 1321-1340 PT Time Calculation (min) (ACUTE ONLY): 19 min  Charges:  $Gait Training: 8-22 mins                     [name], PT, DPT Acute Rehabilitation Services Secure Chat Preferred Office: [phone]    [name] 03/12/2022, 4:10 PM

## 2022-03-12 NOTE — TOC Transition Note (Signed)
Transition of Care [facility]) - CM/SW Discharge Note   Patient Details  Name: [name] MRN: [id number] Date of Birth: November 21, 1929  Transition of Care [facility]) CM/SW Contact:  Bess Kinds, RN Phone Number: [phone] 03/12/2022, 11:54 AM   Clinical Narrative:     Received call back from Onalee Hua at [phone] to discuss post acute transition. Onalee Hua states that his sister will provide transportation home this afternoon. Discussed HH PT - referral accepted by [facility]. Patient has a walker at home and a grab bar in the tub. No further TOC needs identified at this time.    Final next level of care: Home w Home Health Services Barriers to Discharge: No Barriers Identified   Patient Goals and CMS Choice Patient states their goals for this hospitalization and ongoing recovery are:: return home with family CMS Medicare.gov Compare Post Acute Care list provided to:: Patient Represenative (must comment) Choice offered to / list presented to : Patient, Adult Children  Discharge Placement                       Discharge Plan and Services                DME Arranged: N/A DME Agency: NA       HH Arranged: PT HH Agency: [facility] Date HH Agency Contacted: 03/12/22 Time HH Agency Contacted: 1154 Representative spoke with at [facility] Agency: Candise Bowens  Social Determinants of Health (SDOH) Interventions     Readmission Risk Interventions     View : No data to display.

## 2022-03-12 NOTE — ED Triage Notes (Signed)
Ems brings pt in from home for weakness. Family states pt is too weak to be at home. Pt recently discharged from hospital for UTI.

## 2022-03-12 NOTE — H&P (Signed)
History and Physical    [name] DOB: Aug 13, 1930 DOA: 03/12/2022  PCP: Kaleen Mask, MD   Patient coming from: Home   Chief Complaint: Weakness   HPI: [name] is a pleasant 88 y.o. female with medical history significant for dementia, hypertension, hyperlipidemia, GERD, and recent admission for UTI with acute encephalopathy, now presenting to the emergency department with general weakness.  Patient was admitted to the hospital on 03/09/2022 with UTI and acute encephalopathy.  She was requiring intensive assistance with all aspects of her mobility but family wanted the patient back home and she was discharged home with home health earlier today.  Patient's daughter [name]) brought her home from the hospital but the patient required 2 person assist to get inside the house, was unable to ambulate independently like she had been prior to the recent hospitalization, and family had her sent back to the hospital for placement in a rehab facility.  Patient has no complaints, does not remember ever going home from the hospital today, and denies chest pain, cough, shortness of breath, dysuria, or abdominal pain.  ED Course: Upon arrival to the ED, patient is found to be afebrile and saturating well on room air with mild tachycardia and stable blood pressure.  EKG features sinus rhythm with RBBB.  Chest x-ray negative for acute cardiopulmonary disease.  Head CT negative for acute intracranial abnormality.  CT of the chest/abdomen/pelvis was obtained and there was a fluid collection in the uterine fundus of unclear significance.  Blood and urine cultures were collected in the ED and the patient was given 1.25 L of LR, vancomycin, cefepime, and Flagyl.  Review of Systems:  All other systems reviewed and apart from HPI, are negative.  Past Medical History:  Diagnosis Date   Cardiomegaly    /notes 02/17/2013   Gastric ulcer    High cholesterol    History of blood transfusion     Hypertension    UTI (lower urinary tract infection)     Past Surgical History:  Procedure Laterality Date   ESOPHAGOGASTRODUODENOSCOPY ENDOSCOPY     KYPHOPLASTY N/A 01/13/2014   Procedure: Lumbar one KYPHOPLASTY;  Surgeon: Barnett Abu, MD;  Location: [facility];  Service: Neurosurgery;  Laterality: N/A;  L1 KYPHOPLASTY   NO PAST SURGERIES      Social History:   reports that she has been smoking cigarettes. She has a 7.20 pack-year smoking history. She has never used smokeless tobacco. She reports that she does not drink alcohol and does not use drugs.  No Known Allergies  History reviewed. No pertinent family history.   Prior to Admission medications   Medication Sig Start Date End Date Taking? Authorizing Provider  acetaminophen (TYLENOL) 500 MG tablet Take 500 mg by mouth See admin instructions. Take 500 mg by mouth in the morning and an additional 500 mg once a day as needed for pain   Yes [provider]  cephALEXin (KEFLEX) 500 MG capsule Take 1 capsule (500 mg total) by mouth every 8 (eight) hours for 3 days. 03/12/22 03/15/22 Yes Dahal, Sabrina Schools, MD  lisinopril (ZESTRIL) 10 MG tablet Take 10 mg by mouth at bedtime.    Yes [provider]  traMADol (ULTRAM) 50 MG tablet Take 50 mg by mouth every other day as needed (arthritis pain.). Take 50 mg by mouth in the morning and an additional 50 mg once a day as needed for pain 09/19/19  Yes [provider]    Physical Exam: Vitals:  03/12/22 1945 03/12/22 2000 03/12/22 2100 03/12/22 2130  BP: (!) 175/70 (!) 185/80 (!) 148/84   Pulse: 95 (!) 105    Resp: (!) 32 20 17 15   Temp:      TempSrc:      SpO2: 98% 98%      Constitutional: NAD, calm  Eyes: PERTLA, lids and conjunctivae normal ENMT: Mucous membranes are dry. Posterior pharynx clear of any exudate or lesions.   Neck: supple, no masses  Respiratory: no wheezing, no crackles. No accessory muscle use.  Cardiovascular: S1 & S2 heard, regular rate  and rhythm. No extremity edema.   Abdomen: No distension, no tenderness, soft. Bowel sounds active.  Musculoskeletal: no clubbing / cyanosis. No joint deformity upper and lower extremities.   Skin: no significant rashes, lesions, ulcers. Warm, dry, well-perfused. Neurologic: CN 2-12 grossly intact. Moving all extremities. Alert and oriented to self and knows she is in the hospital but does not remember being discharged from the hospital earlier today.  Psychiatric: Pleasant. Cooperative.    Labs and Imaging on Admission: I have personally reviewed following labs and imaging studies  CBC: Recent Labs  Lab 03/10/22 0020 03/10/22 0901 03/12/22 0411 03/12/22 1744  WBC 11.9* 10.6* 12.6* 15.3*  NEUTROABS 9.9* 8.7* 10.3* 13.9*  HGB 12.7 12.5 12.4 11.5*  HCT 38.7 37.3 36.4 34.4*  MCV 92.6 90.8 89.4 90.5  PLT 179 174 195 211   Basic Metabolic Panel: Recent Labs  Lab 03/10/22 0020 03/10/22 0901 03/12/22 0411 03/12/22 1744  NA 136 138 136 137  K 3.5 3.0* 3.5 3.4*  CL 102 102 103 102  CO2 22 27 24 26   GLUCOSE 131* 119* 135* 175*  BUN 16 10 21  25*  CREATININE 0.71 0.68 0.81 0.85  CALCIUM 9.3 9.3 9.1 9.2  MG 1.9 1.8  --  2.1   GFR: Estimated Creatinine Clearance: 26.5 mL/min (by C-G formula based on SCr of 0.85 mg/dL). Liver Function Tests: Recent Labs  Lab 03/10/22 0020 03/10/22 0901 03/12/22 1744  AST 17 18 15   ALT 10 9 11   ALKPHOS 76 75 72  BILITOT 1.4* 0.8 1.1  PROT 7.5 7.4 7.6  ALBUMIN 3.9 3.7 3.4*   No results for input(s): LIPASE, AMYLASE in the last 168 hours. No results for input(s): AMMONIA in the last 168 hours. Coagulation Profile: No results for input(s): INR, PROTIME in the last 168 hours. Cardiac Enzymes: No results for input(s): CKTOTAL, CKMB, CKMBINDEX, TROPONINI in the last 168 hours. BNP (last 3 results) No results for input(s): PROBNP in the last 8760 hours. HbA1C: No results for input(s): HGBA1C in the last 72 hours. CBG: No results for  input(s): GLUCAP in the last 168 hours. Lipid Profile: No results for input(s): CHOL, HDL, LDLCALC, TRIG, CHOLHDL, LDLDIRECT in the last 72 hours. Thyroid Function Tests: Recent Labs    03/10/22 0020 03/12/22 1744  TSH 0.358 0.326*  FREET4 1.10  --    Anemia Panel: No results for input(s): VITAMINB12, FOLATE, FERRITIN, TIBC, IRON, RETICCTPCT in the last 72 hours. Urine analysis:    Component Value Date/Time   COLORURINE YELLOW 03/12/2022 1928   APPEARANCEUR CLEAR 03/12/2022 1928   LABSPEC 1.026 03/12/2022 1928   PHURINE 6.0 03/12/2022 1928   GLUCOSEU 50 (A) 03/12/2022 1928   HGBUR MODERATE (A) 03/12/2022 1928   BILIRUBINUR NEGATIVE 03/12/2022 1928   KETONESUR 5 (A) 03/12/2022 1928   PROTEINUR 30 (A) 03/12/2022 1928   UROBILINOGEN 0.2 03/22/2013 0557   NITRITE NEGATIVE 03/12/2022 1928  LEUKOCYTESUR NEGATIVE 03/12/2022 1928   Sepsis Labs: (procalcitonin:4,lacticidven:4) ) Recent Results (from the past 240 hour(s))  Urine Culture     Status: Abnormal   Collection Time: 03/10/22  1:08 AM   Specimen: Urine, Clean Catch  Result Value Ref Range Status   Specimen Description URINE, CLEAN CATCH  Final   Special Requests   Final    NONE Performed at [facility], [address], [place], [place] [address]    Culture MULTIPLE SPECIES PRESENT, SUGGEST RECOLLECTION (A)  Final   Report Status 03/11/2022 FINAL  Final  Culture, blood (Routine X 2) w Reflex to ID Panel     Status: None (Preliminary result)   Collection Time: 03/10/22  9:08 AM   Specimen: BLOOD  Result Value Ref Range Status   Specimen Description BLOOD SITE NOT SPECIFIED  Final   Special Requests   Final    BOTTLES DRAWN AEROBIC AND ANAEROBIC Blood Culture adequate volume   Culture   Final    NO GROWTH 2 DAYS Performed at [facility], [address] [place], [place] [address]    Report Status PENDING  Incomplete  Culture, blood (Routine X 2) w Reflex to ID Panel     Status: None  (Preliminary result)   Collection Time: 03/10/22  9:12 AM   Specimen: BLOOD  Result Value Ref Range Status   Specimen Description BLOOD SITE NOT SPECIFIED  Final   Special Requests   Final    BOTTLES DRAWN AEROBIC AND ANAEROBIC Blood Culture adequate volume   Culture   Final    NO GROWTH 2 DAYS Performed at [facility], [address] [place], [place] [address]    Report Status PENDING  Incomplete     Radiological Exams on Admission: CT HEAD WO CONTRAST ( )  Result Date: 03/12/2022 CLINICAL DATA:  Altered mental status. EXAM: CT HEAD WITHOUT CONTRAST TECHNIQUE: Contiguous axial images were obtained from the base of the skull through the vertex without intravenous contrast. RADIATION DOSE REDUCTION: This exam was performed according to the departmental dose-optimization program which includes automated exposure control, adjustment of the mA and/or kV according to patient size and/or use of iterative reconstruction technique. COMPARISON:  Head CT dated 03/09/2022. FINDINGS: Brain: Moderate age-related atrophy and chronic microvascular ischemic changes. There is no acute intracranial hemorrhage. No mass effect or midline shift. No extra-axial fluid collection. Vascular: No hyperdense vessel or unexpected calcification. Skull: Normal. Negative for fracture or focal lesion. Sinuses/Orbits: No acute finding. Other: None IMPRESSION: 1. No acute intracranial pathology. 2. Moderate age-related atrophy and chronic microvascular ischemic changes. Electronically Signed   By: Elgie Collard M.D.   On: 03/12/2022 19:46   US Pelvis Complete  Result Date: 03/12/2022 CLINICAL DATA:  Abnormal uterus on abdominal CT EXAM: TRANSABDOMINAL ULTRASOUND OF PELVIS TECHNIQUE: Transabdominal ultrasound examination of the pelvis was performed including evaluation of the uterus, ovaries, adnexal regions, and pelvic cul-de-sac. COMPARISON:  03/12/2022, 01/19/2014 FINDINGS: Uterus The uterus is not well  demonstrated due to marked atrophy. Right ovary Not visualized. Left ovary Not visualized. Other findings: The structure measured by the sonographer within the lower pelvis is actually the pessary within the vaginal cavity. The bladder was not fully distended at the time of the exam. Endovaginal exam was deferred. The rim enhancing fluid collection within the region of the left adnexa on CT is not identified by ultrasound. IMPRESSION: 1. Essentially nondiagnostic evaluation of the pelvis as above. 2. The rim enhancing fluid collection  seen on prior CT within the left adnexa is not identified by transabdominal pelvic ultrasound. Further evaluation with endovaginal pelvic ultrasound could be considered after pessary removal. Electronically Signed   By: Sharlet Salina M.D.   On: 03/12/2022 21:00   CT CHEST ABDOMEN PELVIS W CONTRAST  Result Date: 03/12/2022 CLINICAL DATA:  Patient reports from home with weakness. Recently discharged from hospital after UTI. EXAM: CT CHEST, ABDOMEN, AND PELVIS WITH CONTRAST TECHNIQUE: Multidetector CT imaging of the chest, abdomen and pelvis was performed following the standard protocol during bolus administration of intravenous contrast. RADIATION DOSE REDUCTION: This exam was performed according to the departmental dose-optimization program which includes automated exposure control, adjustment of the mA and/or kV according to patient size and/or use of iterative reconstruction technique. CONTRAST:  65mL OMNIPAQUE IOHEXOL 300 MG/ML  SOLN COMPARISON:  CT AP 01/19/2014 FINDINGS: CT CHEST FINDINGS Cardiovascular: Heart size is within normal limits. Aortic atherosclerosis. Coronary artery atherosclerotic calcifications. No pericardial effusion. Mediastinum/Nodes: No enlarged mediastinal, hilar, or axillary lymph nodes. Thyroid gland, trachea, and esophagus demonstrate no significant findings. Lungs/Pleura: Mild dependent changes with pleural thickening overlying the posterior lung  bases. No pleural effusion, interstitial edema, or airspace consolidation. Centrilobular emphysema. Musculoskeletal: The bones appear diffusely osteopenic. There is a chronic moderate to severe compression fracture involving the T12 vertebral body which is a unchanged from 11/17/2019. The no acute or suspicious osseous findings. CT ABDOMEN PELVIS FINDINGS Hepatobiliary: No suspicious liver lesion. Tiny stones and/or sludge noted within the dependent portion of the gallbladder. No gallbladder wall thickening or signs of bile duct dilatation. Pancreas: Unremarkable. No pancreatic ductal dilatation or surrounding inflammatory changes. Spleen: Normal in size without focal abnormality. Adrenals/Urinary Tract:  Normal appearance of the adrenal glands. The right kidney appears within normal limits. There is chronic parenchymal volume loss and scarring involving the upper and lower pole of the left kidney. Calcification noted within the posterior cortex of the left kidney measuring 3 mm, image 57/2. Mild chronic left-sided hydronephrosis is identified. The left ureter appears chronically dilated without signs of obstructing stone or mass. This appears similar to the previous exam. Urinary bladder is unremarkable. Stomach/Bowel: Stomach appears normal. The appendix is visualized and is normal. No bowel wall thickening, inflammation, or distension. Vascular/Lymphatic: Aortic atherosclerosis. No aneurysm. No signs of abdominopelvic adenopathy. Reproductive: A pessary device is in place. The uterus appears atrophic. There is a peripherally enhancing round fluid collection within the uterine fundus which measures 3.2 x 3.1 cm, image 89/2. Not seen on previous exam. Other: No free fluid or fluid collections. Musculoskeletal: Diffuse osteopenia. Treated compression fracture is identified at L1. Unchanged appearance of severe compression fracture involving the L2 vertebral body. New mild superior endplate fracture deformity is  noted at L4. This is age indeterminate. IMPRESSION: 1. No acute findings identified within the chest, abdomen or pelvis to explain patient's weakness. 2. Mild atrophy with multifocal areas of cortical scarring noted involving the left kidney. 3. Chronic left hydronephrosis and hydroureter. Previously reported as likely secondary to severe pelvic floor relaxation and large cystocele, rectocele an uterine prolapse. 4. New peripherally enhancing fluid collection within the uterine fundus is identified of uncertain significance. Consider more definitive characterization with pelvic sonogram. 5. Chronic compression fractures are noted involving T12, L1 and L2. There is a new, age indeterminate mild superior endplate compression deformity involving the L4 vertebral body compared with 01/19/2014. Electronically Signed   By: Signa Kell M.D.   On: 03/12/2022 20:00   DG Chest Portable 1 View  Result Date: 03/12/2022 CLINICAL DATA:  Weakness and fatigue.  Current smoker. EXAM: PORTABLE CHEST 1 VIEW COMPARISON:  03/09/2022 FINDINGS: Shallow inspiration. Normal heart size and pulmonary vascularity. No focal airspace disease or consolidation in the lungs. No blunting of costophrenic angles. No pneumothorax. Mediastinal contours appear intact. Calcification of the aorta. Degenerative changes in the spine and shoulders. IMPRESSION: No active disease. Electronically Signed   By: Burman Nieves M.D.   On: 03/12/2022 18:16    EKG: Independently reviewed. Sinus rhythm, RBBB, similar to prior.   Assessment/Plan  1. Physical deconditioning  - Pt was discharged from the hospital earlier today after admission for UTI with acute encephalopathy, but was sent back when she was too generally weak to get inside without intensive assistance from her 2 children  - Per PT note just prior to discharge, "Pt continues to require heavy assist for all aspects of mobility"  - Her condition does appear to have changed from time of  discharge and family had wanted her back home but may not have fully appreciated how much assistance would be required   - Family is now interested in SNF placement    2. SIRS  - Leukocytosis and mild tachycardia noted in ED without fever, hypotension, and elevated lactate  - Blood and urine cultures were collected in ED, 30 cc/kg LR bolus was given, and she was treated with vancomycin, cefepime, and Flagyl  - She does not appear to be septic  - She was treated with Rocephin for UTI in hospital starting 5/19; blood cultures were negative from 5/19 and urine grew multiple species  - Continue cefepime for now while following repeat cultures and clinical course    3. Hypertension  - Continue lisinopril    4. Hypokalemia   - Replace potassium   5. Dementia  - At baseline mental status on admission per discussion with family  - Delirium precautions   6. Fluid collection in uterine fundus  - Noted incidentally on CT in ED, significance unclear  - Could not be assessed with transabdominal US, could consider removing pessary and checking endovaginal [place]    DVT prophylaxis: Lovenox  Code Status: Full  Level of Care: Level of care: Med-Surg Family Communication: Son, [name], updated by phone  Disposition Plan:  Patient is from: Home  Anticipated d/c is to: SNF  Anticipated d/c date is: 5/23 or 03/14/22 Patient currently: Pending PT eval  Consults called: none  Admission status: Observation     Briscoe Deutscher, MD [facility]  03/12/2022, 10:09 PM

## 2022-03-12 NOTE — Progress Notes (Signed)
Discharge instructions (including medications) discussed with and copy provided to patient/caregiver.  Patient taken out with daughter via wc.

## 2022-03-12 NOTE — Progress Notes (Signed)
A consult was received from an ED physician for Vancomycin and Cefepime per pharmacy dosing.  The patient's profile has been reviewed for ht/wt/allergies/indication/available labs.    A one time order has been placed for Vancomycin 750mg  IV and Cefepime 2g IV.  Further antibiotics/pharmacy consults should be ordered by admitting physician if indicated.                       Thank you, Luiz Ochoa 03/12/2022  7:17 PM

## 2022-03-12 NOTE — ED Notes (Signed)
RN called to check on nurse assignment. They say purple man placed 33 mins ago. RN cannot see it on her end at all. It is blank. RN asked if she can take patient.

## 2022-03-12 NOTE — ED Provider Notes (Signed)
[facility]-EMERGENCY DEPT Provider Note   CSN: [id number] Arrival date & time: 03/12/22  1708     History  Chief Complaint  Patient presents with   Weakness    [name] is a 88 y.o. female.   Weakness   88 y.o. female with PMH significant for dementia, HTN, HLD, GERD, osteoporosis, gastric ulcer, recent admission and discharge from the hospital for encephalopathy from urinary tract infection, treated with Rocephin and discharged on Keflex who presents to the emergency department with worsening weakness in the setting of known diagnosis of UTI.  Patient has dementia at baseline and is able to provide further HPI no family members were available bedside.  They had reportedly stated to nursing staff that they were unable to care for the patient in the home any longer.  Home Medications Prior to Admission medications   Medication Sig Start Date End Date Taking? Authorizing Provider  acetaminophen (TYLENOL) 500 MG tablet Take 500 mg by mouth See admin instructions. Take 500 mg by mouth in the morning and an additional 500 mg once a day as needed for pain   Yes [provider]  cephALEXin (KEFLEX) 500 MG capsule Take 1 capsule (500 mg total) by mouth every 8 (eight) hours for 3 days. 03/12/22 03/15/22 Yes Dahal, Melina Schools, MD  lisinopril (ZESTRIL) 10 MG tablet Take 10 mg by mouth at bedtime.    Yes [provider]  traMADol (ULTRAM) 50 MG tablet Take 50 mg by mouth every other day as needed (arthritis pain.). Take 50 mg by mouth in the morning and an additional 50 mg once a day as needed for pain 09/19/19  Yes [provider]      Allergies    Patient has no known allergies.    Review of Systems   Review of Systems  Neurological:  Positive for weakness.  All other systems reviewed and are negative.  Physical Exam Updated Vital Signs BP (!) 185/80   Pulse (!) 105   Temp 98.4 F (36.9 C) (Oral)   Resp 20   SpO2 98%  Physical  Exam Vitals and nursing note reviewed.  Constitutional:      Appearance: She is well-developed.     Comments: Disoriented, alert and oriented x1, GCS 14  HENT:     Head: Normocephalic and atraumatic.     Mouth/Throat:     Mouth: Mucous membranes are dry.  Eyes:     Conjunctiva/sclera: Conjunctivae normal.  Cardiovascular:     Rate and Rhythm: Regular rhythm. Tachycardia present.  Pulmonary:     Effort: Pulmonary effort is normal. No respiratory distress.     Breath sounds: Normal breath sounds.  Abdominal:     Palpations: Abdomen is soft.     Tenderness: There is no abdominal tenderness.  Musculoskeletal:        General: No swelling.     Cervical back: Neck supple.  Skin:    General: Skin is warm and dry.     Capillary Refill: Capillary refill takes less than 2 seconds.  Neurological:     Mental Status: She is alert.  Psychiatric:        Mood and Affect: Mood normal.    ED Results / Procedures / Treatments   Labs (all labs ordered are listed, but only abnormal results are displayed) Labs Reviewed  URINALYSIS, ROUTINE W REFLEX MICROSCOPIC - Abnormal; Notable for the following components:      Result Value   Glucose, UA 50 (*)  Hgb urine dipstick MODERATE (*)    Ketones, ur 5 (*)    Protein, ur 30 (*)    Bacteria, UA RARE (*)    All other components within normal limits  CBC WITH DIFFERENTIAL/PLATELET - Abnormal; Notable for the following components:   WBC 15.3 (*)    RBC 3.80 (*)    Hemoglobin 11.5 (*)    HCT 34.4 (*)    Neutro Abs 13.9 (*)    Lymphs Abs 0.3 (*)    Abs Immature Granulocytes 0.09 (*)    All other components within normal limits  COMPREHENSIVE METABOLIC PANEL - Abnormal; Notable for the following components:   Potassium 3.4 (*)    Glucose, Bld 175 (*)    BUN 25 (*)    Albumin 3.4 (*)    All other components within normal limits  TSH - Abnormal; Notable for the following components:   TSH 0.326 (*)    All other components within normal limits   URINE CULTURE  CULTURE, BLOOD (ROUTINE X 2)  CULTURE, BLOOD (ROUTINE X 2)  MAGNESIUM  LACTIC ACID, PLASMA  T4, FREE  LACTIC ACID, PLASMA  TROPONIN I (HIGH SENSITIVITY)  TROPONIN I (HIGH SENSITIVITY)    EKG EKG Interpretation  Date/Time:  Monday Mar 12 2022 17:41:54 EDT Ventricular Rate:  98 PR Interval:  184 QRS Duration: 133 QT Interval:  402 QTC Calculation: 514 R Axis:   22 Text Interpretation: Sinus rhythm Right bundle branch block ST elevation, consider inferior injury Confirmed by Ernie Avena ([id number]) on 03/12/2022 7:05:04 PM  Radiology CT HEAD WO CONTRAST ( )  Result Date: 03/12/2022 CLINICAL DATA:  Altered mental status. EXAM: CT HEAD WITHOUT CONTRAST TECHNIQUE: Contiguous axial images were obtained from the base of the skull through the vertex without intravenous contrast. RADIATION DOSE REDUCTION: This exam was performed according to the departmental dose-optimization program which includes automated exposure control, adjustment of the mA and/or kV according to patient size and/or use of iterative reconstruction technique. COMPARISON:  Head CT dated 03/09/2022. FINDINGS: Brain: Moderate age-related atrophy and chronic microvascular ischemic changes. There is no acute intracranial hemorrhage. No mass effect or midline shift. No extra-axial fluid collection. Vascular: No hyperdense vessel or unexpected calcification. Skull: Normal. Negative for fracture or focal lesion. Sinuses/Orbits: No acute finding. Other: None IMPRESSION: 1. No acute intracranial pathology. 2. Moderate age-related atrophy and chronic microvascular ischemic changes. Electronically Signed   By: Elgie Collard M.D.   On: 03/12/2022 19:46   US Pelvis Complete  Result Date: 03/12/2022 CLINICAL DATA:  Abnormal uterus on abdominal CT EXAM: TRANSABDOMINAL ULTRASOUND OF PELVIS TECHNIQUE: Transabdominal ultrasound examination of the pelvis was performed including evaluation of the uterus, ovaries, adnexal  regions, and pelvic cul-de-sac. COMPARISON:  03/12/2022, 01/19/2014 FINDINGS: Uterus The uterus is not well demonstrated due to marked atrophy. Right ovary Not visualized. Left ovary Not visualized. Other findings: The structure measured by the sonographer within the lower pelvis is actually the pessary within the vaginal cavity. The bladder was not fully distended at the time of the exam. Endovaginal exam was deferred. The rim enhancing fluid collection within the region of the left adnexa on CT is not identified by ultrasound. IMPRESSION: 1. Essentially nondiagnostic evaluation of the pelvis as above. 2. The rim enhancing fluid collection seen on prior CT within the left adnexa is not identified by transabdominal pelvic ultrasound. Further evaluation with endovaginal pelvic ultrasound could be considered after pessary removal. Electronically Signed   By: Sharlet Salina M.D.   On: 03/12/2022  21:00   CT CHEST ABDOMEN PELVIS W CONTRAST  Result Date: 03/12/2022 CLINICAL DATA:  Patient reports from home with weakness. Recently discharged from hospital after UTI. EXAM: CT CHEST, ABDOMEN, AND PELVIS WITH CONTRAST TECHNIQUE: Multidetector CT imaging of the chest, abdomen and pelvis was performed following the standard protocol during bolus administration of intravenous contrast. RADIATION DOSE REDUCTION: This exam was performed according to the departmental dose-optimization program which includes automated exposure control, adjustment of the mA and/or kV according to patient size and/or use of iterative reconstruction technique. CONTRAST:  65mL OMNIPAQUE IOHEXOL 300 MG/ML  SOLN COMPARISON:  CT AP 01/19/2014 FINDINGS: CT CHEST FINDINGS Cardiovascular: Heart size is within normal limits. Aortic atherosclerosis. Coronary artery atherosclerotic calcifications. No pericardial effusion. Mediastinum/Nodes: No enlarged mediastinal, hilar, or axillary lymph nodes. Thyroid gland, trachea, and esophagus demonstrate no  significant findings. Lungs/Pleura: Mild dependent changes with pleural thickening overlying the posterior lung bases. No pleural effusion, interstitial edema, or airspace consolidation. Centrilobular emphysema. Musculoskeletal: The bones appear diffusely osteopenic. There is a chronic moderate to severe compression fracture involving the T12 vertebral body which is a unchanged from 11/17/2019. The no acute or suspicious osseous findings. CT ABDOMEN PELVIS FINDINGS Hepatobiliary: No suspicious liver lesion. Tiny stones and/or sludge noted within the dependent portion of the gallbladder. No gallbladder wall thickening or signs of bile duct dilatation. Pancreas: Unremarkable. No pancreatic ductal dilatation or surrounding inflammatory changes. Spleen: Normal in size without focal abnormality. Adrenals/Urinary Tract:  Normal appearance of the adrenal glands. The right kidney appears within normal limits. There is chronic parenchymal volume loss and scarring involving the upper and lower pole of the left kidney. Calcification noted within the posterior cortex of the left kidney measuring 3 mm, image 57/2. Mild chronic left-sided hydronephrosis is identified. The left ureter appears chronically dilated without signs of obstructing stone or mass. This appears similar to the previous exam. Urinary bladder is unremarkable. Stomach/Bowel: Stomach appears normal. The appendix is visualized and is normal. No bowel wall thickening, inflammation, or distension. Vascular/Lymphatic: Aortic atherosclerosis. No aneurysm. No signs of abdominopelvic adenopathy. Reproductive: A pessary device is in place. The uterus appears atrophic. There is a peripherally enhancing round fluid collection within the uterine fundus which measures 3.2 x 3.1 cm, image 89/2. Not seen on previous exam. Other: No free fluid or fluid collections. Musculoskeletal: Diffuse osteopenia. Treated compression fracture is identified at L1. Unchanged appearance of  severe compression fracture involving the L2 vertebral body. New mild superior endplate fracture deformity is noted at L4. This is age indeterminate. IMPRESSION: 1. No acute findings identified within the chest, abdomen or pelvis to explain patient's weakness. 2. Mild atrophy with multifocal areas of cortical scarring noted involving the left kidney. 3. Chronic left hydronephrosis and hydroureter. Previously reported as likely secondary to severe pelvic floor relaxation and large cystocele, rectocele an uterine prolapse. 4. New peripherally enhancing fluid collection within the uterine fundus is identified of uncertain significance. Consider more definitive characterization with pelvic sonogram. 5. Chronic compression fractures are noted involving T12, L1 and L2. There is a new, age indeterminate mild superior endplate compression deformity involving the L4 vertebral body compared with 01/19/2014. Electronically Signed   By: Signa Kell M.D.   On: 03/12/2022 20:00   DG Chest Portable 1 View  Result Date: 03/12/2022 CLINICAL DATA:  Weakness and fatigue.  Current smoker. EXAM: PORTABLE CHEST 1 VIEW COMPARISON:  03/09/2022 FINDINGS: Shallow inspiration. Normal heart size and pulmonary vascularity. No focal airspace disease or consolidation in the lungs. No  blunting of costophrenic angles. No pneumothorax. Mediastinal contours appear intact. Calcification of the aorta. Degenerative changes in the spine and shoulders. IMPRESSION: No active disease. Electronically Signed   By: Burman Nieves M.D.   On: 03/12/2022 18:16    Procedures .Critical Care Performed by: Ernie Avena, MD Authorized by: Ernie Avena, MD   Critical care provider statement:    Critical care time (minutes):  30   Critical care was necessary to treat or prevent imminent or life-threatening deterioration of the following conditions:  Sepsis   Critical care was time spent personally by me on the following activities:  Development of  treatment plan with patient or surrogate, discussions with consultants, evaluation of patient's response to treatment, examination of patient, ordering and review of laboratory studies, ordering and review of radiographic studies, ordering and performing treatments and interventions, pulse oximetry, re-evaluation of patient's condition and review of old charts   Care discussed with: admitting provider      Medications Ordered in ED Medications  lactated ringers infusion (has no administration in time range)  lactated ringers bolus 1,000 mL (0 mLs Intravenous Stopped 03/12/22 2112)    And  lactated ringers bolus 250 mL (250 mLs Intravenous New Bag/Given 03/12/22 2113)  vancomycin (VANCOREADY) IVPB 750 mg/150 mL (has no administration in time range)  ceFEPIme (MAXIPIME) 2 g in sodium chloride 0.9 % 100 mL IVPB (0 g Intravenous Stopped 03/12/22 2114)  metroNIDAZOLE (FLAGYL) IVPB 500 mg (0 mg Intravenous Stopped 03/12/22 2112)  iohexol (OMNIPAQUE) 300 MG/ML solution 65 mL (65 mLs Intravenous Contrast Given 03/12/22 1916)    ED Course/ Medical Decision Making/ A&P Clinical Course as of 03/12/22 2115  Mon Mar 12, 2022  1905 WBC(!): 15.3 [JL]  1905 ECG Heart Rate: 99 [JL]    Clinical Course User Index [JL] Ernie Avena, MD                           Medical Decision Making Amount and/or Complexity of Data Reviewed Labs: ordered. Decision-making details documented in ED Course. Radiology: ordered.  Risk Prescription drug management. Decision regarding hospitalization.   88 y.o. female with PMH significant for dementia, HTN, HLD, GERD, osteoporosis, gastric ulcer, recent admission and discharge from the hospital for encephalopathy from urinary tract infection, treated with Rocephin and discharged on Keflex who presents to the emergency department with worsening weakness in the setting of known diagnosis of UTI.  Patient has dementia at baseline and is able to provide further HPI no family  members were available bedside.  They had reportedly stated to nursing staff that they were unable to care for the patient in the home any longer.  On arrival, the patient was afebrile, tachycardic P100 with heart rates ranging in the mid 90s to high 90s, hypertensive BP 194/75, saturating 99% on room air.  Physical exam significant for dry mucous membranes, tachycardia noted intermittently.  My immediate concern is for a life-threatening infection in this patient. Evaluation and treatment for sepsis began on my evaluation.  The most likely infectious source is genitourinary.  Thorough skin exam revealed no significant open wounds.  Labs: Laboratory work-up significant for TSH low at 0.33, T4 pending, urinalysis with rare bacteria (the patient had been on Rocephin inpatient until her discharge this morning when she had been discharged on Keflex), CBC with a leukocytosis to 15.3, stable anemia to 11.5, CMP with mild hypokalemia to 3.4, hyperglycemia of 11/22/1973, elevated BUN to 25, normal serum creatinine, no  LFT abnormality.    Imaging: CT abdomen pelvis with contrast was performed in addition to CT chest which was negative for focal findings to evaluate for the patient's presentation.  CT head was without acute abnormality, reviewed by myself and radiology, chest x-ray without active airspace disease, reviewed and interpreted by myself in addition to radiology.  There was a questionable finding of fluid collection along the uterine fundus for which a pelvic ultrasound was performed which was nondiagnostic.  For treatment, the patient was given 30cc/kg LR for IVF and vancomycin, cefepime and Flagyl for antimicrobials.  Upon reassessment, the patient appears clinically improved. The patient is not currently hypotensive and has a MAP of >65. The patient's volume status appears to be improved after fluid resuscitation.  Based off the presentation and lab findings, the patient meets criteria for  sepsis.  [SEVERE SEPSIS] HYPOTENSION (Nursing BPA for hypotension = Is this sepsis?) CREATININE > 2.0, or urine output < 0.5 mL/kg/hour for 2 hours  BILIRUBIN > 2 mg/dL (16.134.2 mmol/L)  PLATELET COUNT < 100,000  INR > 1.5 or aPTT > 60 sec  LACTATE > 2 mmol/L   [SEPTIC SHOCK] HYPOTENSION REFRACTORY TO 30 cc/kg OF FLUIDS LACTATE > 4 mmol/L  While in the ED, the patient was given: Medications  lactated ringers infusion (has no administration in time range)  lactated ringers bolus 1,000 mL (0 mLs Intravenous Stopped 03/12/22 2112)    And  lactated ringers bolus 250 mL (250 mLs Intravenous New Bag/Given 03/12/22 2113)  vancomycin (VANCOREADY) IVPB 750 mg/150 mL (has no administration in time range)  ceFEPIme (MAXIPIME) 2 g in sodium chloride 0.9 % 100 mL IVPB (0 g Intravenous Stopped 03/12/22 2114)  metroNIDAZOLE (FLAGYL) IVPB 500 mg (0 mg Intravenous Stopped 03/12/22 2112)  iohexol (OMNIPAQUE) 300 MG/ML solution 65 mL (65 mLs Intravenous Contrast Given 03/12/22 1916)     Jonn ShinglesYoko Sweeny was admitted to the hospital for ongoing treatment and monitoring. Dr. Antionette Charpyd accepted the patient in admission.  Final Clinical Impression(s) / ED Diagnoses Final diagnoses:  Sepsis, due to unspecified organism, unspecified whether acute organ dysfunction present [facility](HCC)  Urinary tract infection without hematuria, site unspecified    Rx / DC Orders ED Discharge Orders     None         Ernie AvenaLawsing, Rourke Mcquitty, MD 03/12/22 2119

## 2022-03-12 NOTE — Progress Notes (Signed)
Pharmacy Antibiotic Note  [name] is a 88 y.o. female admitted on 03/12/2022 with UTI/SIRS.  Recent hospitalization for UTI with acute encephalopathy (discharged earlier today).  Pharmacy has been consulted for Cefepime dosing.  Plan: Cefepime 2gm IV q24h Follow renal function Follow culture results and sensitivities    Temp (24hrs), Avg:98.3 F (36.8 C), Min:98.2 F (36.8 C), Max:98.5 F (36.9 C)  Recent Labs  Lab 03/10/22 0020 03/10/22 0901 03/12/22 0411 03/12/22 1744 03/12/22 1941  WBC 11.9* 10.6* 12.6* 15.3*  --   CREATININE 0.71 0.68 0.81 0.85  --   LATICACIDVEN  --   --   --   --  1.0    Estimated Creatinine Clearance: 26.5 mL/min (by C-G formula based on SCr of 0.85 mg/dL).    No Known Allergies  Antimicrobials this admission: 5/21 Ceftriaxone x 1 5/22 Cephalexin >> 5/22 5/22 Metronidazole x 1 5/22 Vancomycin x 1 5/22 Cefepime >>  Dose adjustments this admission:    Microbiology results: 5/22 BCx:   5/22 UCx:      Thank you for allowing pharmacy to be a part of this patient's care.  Maryellen Pile, PharmD 03/12/2022 10:29 PM

## 2022-03-12 NOTE — Plan of Care (Signed)

## 2022-03-12 NOTE — Sepsis Progress Note (Signed)
Following for sepsis monitoring ?

## 2022-03-12 NOTE — Discharge Summary (Signed)
Physician Discharge Summary  [name] [id number] DOB: 1930/04/04 DOA: 03/09/2022  PCP: Leonard Downing, MD  Admit date: 03/09/2022 Discharge date: 03/12/2022  Admitted From: Home Discharge disposition: Home with home health  Brief narrative: [name] is a 88 y.o. female with PMH significant for dementia, HTN, HLD, GERD, osteoporosis, gastric ulcer. Patient presented to ED on 5/19 with complaint of dysuria, progressively worsening for 2 weeks associated with generalized weakness, confusion. In the ED, patient had temperature of 98.2, blood pressure 161/79 Lab with WC count 11.9 otherwise unremarkable, urinalysis with hazy yellow urine with moderate amount of hemoglobin, 20 ketones, large leukocytes and rare bacteria Cultures sent Patient started on IV fluid, IV Rocephin. Admitted to hospitalist service.   Subjective: Patient was seen and examined this morning. Sitting up in chair.  Not in distress and no new symptoms.  Family not at bedside today.  Patient feels ready to go home today.  Principal Problem:   Acute cystitis without hematuria Active Problems:   Acute metabolic encephalopathy   Essential hypertension   Mixed hyperlipidemia   Dementia without behavioral disturbance (HCC)   GERD without esophagitis    Assessment and Plan: Acute cystitis without hematuria -Presented with progressively worsening dysuria, generalized weakness, confusion for 2 weeks  -Likely secondary to UTI as suggested by urinalysis  -Urine culture report showed multiple species.  No significant specimen bacterial growth.  Because of her significant symptoms, I will treat her as true UTI.  Currently improving on IV Rocephin.  Discharged home on oral Keflex for next 3 days.  Acute metabolic encephalopathy Dementia -Delirium precipitated by UTI in the setting of dementia  -Mental status improved with treatment of UTI.   Essential hypertension -Continue lisinopril as before.  Mixed  hyperlipidemia -Continue statin    GERD -PPI.  Goals of care   Code Status: Full Code    Mobility: Encourage ambulation  Skin assessment:     Nutritional status:  Body mass index is 15.4 kg/m.            Wounds:  - Incision (Closed) 01/13/14 Back (Active)  Date First Assessed/Time First Assessed: 01/13/14 1648   Location: Back    Assessments 01/13/2014  6:40 PM 01/16/2014  7:38 AM  Dressing Type Liquid skin adhesive Liquid skin adhesive  Dressing Clean;Dry;Intact Clean;Dry;Intact     No Linked orders to display    Discharge Exam:   Vitals:   03/11/22 1431 03/11/22 2138 03/12/22 0346 03/12/22 0735  BP: 130/68 (!) 152/95 (!) 146/72 (!) 166/78  Pulse: (!) 106 (!) 105 91 100  Resp: 18 19 15 18   Temp: 98.3 F (36.8 C) 97.8 F (36.6 C) 98.2 F (36.8 C) 98.2 F (36.8 C)  TempSrc:  Axillary Oral Oral  SpO2: 97% 96% 98% 100%  Weight:      Height:        Body mass index is 15.4 kg/m.  General exam: Pleasant, elderly female not in physical distress Skin: No rashes, lesions or ulcers. HEENT: Atraumatic, normocephalic, no obvious bleeding Lungs: Clear to auscultation bilaterally. CVS: Regular rate and rhythm, no murmur GI/Abd soft, nontender, nondistended, bowel sound present CNS: Alert, awake, oriented to place and person.  Not oriented to time at baseline. Psychiatry: Cheerful Extremities: No pedal edema, no calf tenderness  Follow ups:    Follow-up Information     Leonard Downing, MD Follow up.   Specialty: Family Medicine Contact information: [address] [place] [address] [phone]  Discharge Instructions:   Discharge Instructions     Call MD for:  difficulty breathing, headache or visual disturbances   Complete by: As directed    Call MD for:  extreme fatigue   Complete by: As directed    Call MD for:  hives   Complete by: As directed    Call MD for:  persistant dizziness or light-headedness    Complete by: As directed    Call MD for:  persistant nausea and vomiting   Complete by: As directed    Call MD for:  severe uncontrolled pain   Complete by: As directed    Call MD for:  temperature >100.4   Complete by: As directed    Diet general   Complete by: As directed    Discharge instructions   Complete by: As directed    General discharge instructions: Follow with Primary MD Leonard Downing, MD in 7 days  Please request your PCP  to go over your hospital tests, procedures, radiology results at the follow up. Please get your medicines reviewed and adjusted.  Your PCP may decide to repeat certain labs or tests as needed. Do not drive, operate heavy machinery, perform activities at heights, swimming or participation in water activities or provide baby sitting services if your were admitted for syncope or siezures until you have seen by Primary MD or a Neurologist and advised to do so again. [place] Controlled Substance Reporting System database was reviewed. Do not drive, operate heavy machinery, perform activities at heights, swim, participate in water activities or provide baby-sitting services while on medications for pain, sleep and mood until your outpatient physician has reevaluated you and advised to do so again.  You are strongly recommended to comply with the dose, frequency and duration of prescribed medications. Activity: As tolerated with Full fall precautions use walker/cane & assistance as needed Avoid using any recreational substances like cigarette, tobacco, alcohol, or non-prescribed drug. If you experience worsening of your admission symptoms, develop shortness of breath, life threatening emergency, suicidal or homicidal thoughts you must seek medical attention immediately by calling 911 or calling your MD immediately  if symptoms less severe. You must read complete instructions/literature along with all the possible adverse reactions/side effects for all the  medicines you take and that have been prescribed to you. Take any new medicine only after you have completely understood and accepted all the possible adverse reactions/side effects.  Wear Seat belts while driving. You were cared for by a hospitalist during your hospital stay. If you have any questions about your discharge medications or the care you received while you were in the hospital after you are discharged, you can call the unit and ask to speak with the hospitalist or the covering physician. Once you are discharged, your primary care physician will handle any further medical issues. Please note that NO REFILLS for any discharge medications will be authorized once you are discharged, as it is imperative that you return to your primary care physician (or establish a relationship with a primary care physician if you do not have one).   Increase activity slowly   Complete by: As directed        Discharge Medications:   Allergies as of 03/12/2022   No Known Allergies      Medication List     STOP taking these medications    atenolol 100 MG tablet Commonly known as: TENORMIN   triamterene-hydrochlorothiazide 75-50 MG tablet Commonly known as: MAXZIDE  TAKE these medications    acetaminophen 500 MG tablet Commonly known as: TYLENOL Take 500 mg by mouth See admin instructions. Take 500 mg by mouth in the morning and an additional 500 mg once a day as needed for pain   cephALEXin 500 MG capsule Commonly known as: KEFLEX Take 1 capsule (500 mg total) by mouth every 8 (eight) hours for 3 days.   lisinopril 10 MG tablet Commonly known as: ZESTRIL Take 10 mg by mouth at bedtime.   traMADol 50 MG tablet Commonly known as: ULTRAM Take 50 mg by mouth every other day as needed (arthritis pain.). Take 50 mg by mouth in the morning and an additional 50 mg once a day as needed for pain         The results of significant diagnostics from this hospitalization (including  imaging, microbiology, ancillary and laboratory) are listed below for reference.    Procedures and Diagnostic Studies:   DG Chest 2 View  Result Date: 03/09/2022 CLINICAL DATA:  Weakness dysuria EXAM: CHEST - 2 VIEW COMPARISON:  01/24/2020 FINDINGS: Cardiac shadow is at the upper limits of in size. The lungs are well aerated bilaterally. No focal infiltrate or effusion is seen. No acute bony abnormality is noted. IMPRESSION: No active cardiopulmonary disease. Electronically Signed   By: Inez Catalina M.D.   On: 03/09/2022 23:48   CT Head Wo Contrast  Result Date: 03/09/2022 CLINICAL DATA:  Mental status change, unknown cause EXAM: CT HEAD WITHOUT CONTRAST TECHNIQUE: Contiguous axial images were obtained from the base of the skull through the vertex without intravenous contrast. RADIATION DOSE REDUCTION: This exam was performed according to the departmental dose-optimization program which includes automated exposure control, adjustment of the mA and/or kV according to patient size and/or use of iterative reconstruction technique. COMPARISON:  11/17/2019 FINDINGS: Brain: Normal anatomic configuration. Parenchymal volume loss is commensurate with the patient's age. Mild periventricular white matter changes are present likely reflecting the sequela of small vessel ischemia. Remote lacunar infarcts within the inferior basal ganglia bilaterally are unchanged. No abnormal intra or extra-axial mass lesion or fluid collection. No abnormal mass effect or midline shift. No evidence of acute intracranial hemorrhage or infarct. Ventricular size is normal. Cerebellum unremarkable. Vascular: No asymmetric hyperdense vasculature at the skull base. Skull: Intact Sinuses/Orbits: Paranasal sinuses are clear. Orbits are unremarkable. Other: Mastoid air cells and middle ear cavities are clear. IMPRESSION: No acute intracranial abnormality. Stable senescent change and remote lacunar infarcts. Electronically Signed   By: Fidela Salisbury M.D.   On: 03/09/2022 23:57     Labs:   Basic Metabolic Panel: Recent Labs  Lab 03/10/22 0020 03/10/22 0901 03/12/22 0411  NA 136 138 136  K 3.5 3.0* 3.5  CL 102 102 103  CO2 22 27 24   GLUCOSE 131* 119* 135*  BUN 16 10 21   CREATININE 0.71 0.68 0.81  CALCIUM 9.3 9.3 9.1  MG 1.9 1.8  --    GFR Estimated Creatinine Clearance: 27.8 mL/min (by C-G formula based on SCr of 0.81 mg/dL). Liver Function Tests: Recent Labs  Lab 03/10/22 0020 03/10/22 0901  AST 17 18  ALT 10 9  ALKPHOS 76 75  BILITOT 1.4* 0.8  PROT 7.5 7.4  ALBUMIN 3.9 3.7   No results for input(s): LIPASE, AMYLASE in the last 168 hours. No results for input(s): AMMONIA in the last 168 hours. Coagulation profile No results for input(s): INR, PROTIME in the last 168 hours.  CBC: Recent Labs  Lab 03/10/22  0020 03/10/22 0901 03/12/22 0411  WBC 11.9* 10.6* 12.6*  NEUTROABS 9.9* 8.7* 10.3*  HGB 12.7 12.5 12.4  HCT 38.7 37.3 36.4  MCV 92.6 90.8 89.4  PLT 179 174 195   Cardiac Enzymes: No results for input(s): CKTOTAL, CKMB, CKMBINDEX, TROPONINI in the last 168 hours. BNP: Invalid input(s): POCBNP CBG: No results for input(s): GLUCAP in the last 168 hours. D-Dimer No results for input(s): DDIMER in the last 72 hours. Hgb A1c No results for input(s): HGBA1C in the last 72 hours. Lipid Profile No results for input(s): CHOL, HDL, LDLCALC, TRIG, CHOLHDL, LDLDIRECT in the last 72 hours. Thyroid function studies Recent Labs    03/10/22 0020  TSH 0.358   Anemia work up No results for input(s): VITAMINB12, FOLATE, FERRITIN, TIBC, IRON, RETICCTPCT in the last 72 hours. Microbiology Recent Results (from the past 240 hour(s))  Urine Culture     Status: Abnormal   Collection Time: 03/10/22  1:08 AM   Specimen: Urine, Clean Catch  Result Value Ref Range Status   Specimen Description URINE, CLEAN CATCH  Final   Special Requests   Final    NONE Performed at [facility], [place] [address] [place], [place] [address]    Culture MULTIPLE SPECIES PRESENT, SUGGEST RECOLLECTION (A)  Final   Report Status 03/11/2022 FINAL  Final  Culture, blood (Routine X 2) w Reflex to ID Panel     Status: None (Preliminary result)   Collection Time: 03/10/22  9:08 AM   Specimen: BLOOD  Result Value Ref Range Status   Specimen Description BLOOD SITE NOT SPECIFIED  Final   Special Requests   Final    BOTTLES DRAWN AEROBIC AND ANAEROBIC Blood Culture adequate volume   Culture   Final    NO GROWTH 2 DAYS Performed at [facility], [place] [address], [place], [place] [address]    Report Status PENDING  Incomplete  Culture, blood (Routine X 2) w Reflex to ID Panel     Status: None (Preliminary result)   Collection Time: 03/10/22  9:12 AM   Specimen: BLOOD  Result Value Ref Range Status   Specimen Description BLOOD SITE NOT SPECIFIED  Final   Special Requests   Final    BOTTLES DRAWN AEROBIC AND ANAEROBIC Blood Culture adequate volume   Culture   Final    NO GROWTH 2 DAYS Performed at [facility], [address], [place], [place] [address]    Report Status PENDING  Incomplete    Time coordinating discharge: 35 minutes  Signed: Cortina Vultaggio  [facility] 03/12/2022, 10:47 AM

## 2022-03-12 NOTE — Evaluation (Signed)
Occupational Therapy Evaluation Patient Details Name: [name] MRN: [id number] DOB: 1930/03/29 Today's Date: 03/12/2022   History of Present Illness Pt is a 88 y/o female who presents to the ED 03/09/22 with dysuria, progressively worsening for 2 weeks associated with generalized weakness, confusion. PMH significant for cardiomegaly, kyphoplasty 2015, dementia, HTN, osteoporosis.   Clinical Impression   PTA, pt lives with son, typically Independent with ADLs and ambulatory without a device. Pt presents now with deficits in strength, standing balance, endurance and cognition. Pt limited in standing abilities initially due to B LE stiffness/discomfort - improving with A/AROM stretching to encourage knee extension. Pt receptive to RW education today though continues to require Mod-Max A for basic transfers with hands on assist needed to maintain balance and maneuver RW. Pt requires Setup for UB ADLs and overall Mod A for LB ADLs due to deficits. Pt will require hands on assist for all standing ADLs/mobility as pt is at high risk for falls. Pt appears hesitant to have ADL assist at home - [facility]. Based on son's preference and ability to provide physical assist at DC, recommend HHOT. Pt reports having a Rollator, would benefit from youth RW for improved stability at home, [facility] to inability to ambulate to bathroom, and consideration of wheelchair for safe initial household mobility.      Recommendations for follow up therapy are one component of a multi-disciplinary discharge planning process, led by the attending physician.  Recommendations may be updated based on patient status, additional functional criteria and insurance authorization.   Follow Up Recommendations  Home health OT (family would like for pt to return home; can provide increased physical assist)    Assistance Recommended at Discharge Frequent or constant Supervision/Assistance  Patient can return home with  the following A lot of help with walking and/or transfers;A lot of help with bathing/dressing/bathroom;Assistance with cooking/housework;Help with stairs or ramp for entrance    Functional Status Assessment  Patient has had a recent decline in their functional status and demonstrates the ability to make significant improvements in function in a reasonable and predictable amount of time.  Equipment Recommendations  Wheelchair (measurements OT);Wheelchair cushion (measurements OT);Other (comment);BSC/3in1 (youth rolling walker)    Recommendations for Other Services       Precautions / Restrictions Precautions Precautions: Fall Restrictions Weight Bearing Restrictions: No      Mobility Bed Mobility Overal bed mobility: Needs Assistance Bed Mobility: Supine to Sit     Supine to sit: Min assist, HOB elevated     General bed mobility comments: able to swing LEs off of bed, light assist to lift trunk and gain balance initially    Transfers Overall transfer level: Needs assistance Equipment used: Rolling walker (2 wheels) Transfers: Sit to/from Stand, Bed to chair/wheelchair/BSC Sit to Stand: Max assist     Step pivot transfers: Mod assist     General transfer comment: Initially Max A to stand from bedside with RW - unsuccessful on first 2-3 attempts due to BLE stiffness/discomfort. Eventually, pt able to stand with kyphotic posture holding to RW and take small shuffling steps to recliner with assist to maintain balance/manuever RW      Balance Overall balance assessment: Needs assistance Sitting-balance support: No upper extremity supported, Feet supported Sitting balance-Leahy Scale: Fair     Standing balance support: Bilateral upper extremity supported, During functional activity Standing balance-Leahy Scale: Poor Standing balance comment: RW + external support  ADL either performed or assessed with clinical judgement   ADL  Overall ADL's : Needs assistance/impaired Eating/Feeding: Independent;Sitting   Grooming: Set up;Sitting   Upper Body Bathing: Set up;Sitting   Lower Body Bathing: Moderate assistance;Sit to/from stand;Sitting/lateral leans   Upper Body Dressing : Set up;Sitting   Lower Body Dressing: Moderate assistance;Sitting/lateral leans;Sit to/from stand Lower Body Dressing Details (indicate cue type and reason): able to cross LEs sitting EOB (with increased time/stretching prior due to stiffness). Will need assist to don around waist due to assist level needed to stand/maintain balance Toilet Transfer: Moderate assistance;Stand-pivot;BSC/3in1;Rolling walker (2 wheels)   Toileting- Clothing Manipulation and Hygiene: Moderate assistance;Sit to/from stand;Sitting/lateral lean         General ADL Comments: Limited by BLE stiffness (difficulty with knee extension), significant standing assist needed requiring assist for all LB ADLs as pt at high risk for falls     Vision Baseline Vision/History: 1 Wears glasses Ability to See in Adequate Light: 1 Impaired Patient Visual Report: No change from baseline Vision Assessment?: No apparent visual deficits     Perception     Praxis      Pertinent Vitals/Pain Pain Assessment Pain Assessment: No/denies pain     Hand Dominance Right   Extremity/Trunk Assessment Upper Extremity Assessment Upper Extremity Assessment: Generalized weakness   Lower Extremity Assessment Lower Extremity Assessment: Defer to PT evaluation   Cervical / Trunk Assessment Cervical / Trunk Assessment: Kyphotic   Communication Communication Communication: No difficulties   Cognition Arousal/Alertness: Awake/alert Behavior During Therapy: Flat affect, WFL for tasks assessed/performed Overall Cognitive Status: History of cognitive impairments - at baseline                                 General Comments: dementia at baseline, appears to be improving  cognitively from admission notes. follows one step commands well, shows insight into deficits (LE stiffness), consistently modifies techniques when cued. Reports she did not have breakfast this AM, confirmed with kitchen that this is accurate and pt able to search menu to order food with OT present     General Comments       Exercises Exercises: Other exercises Other Exercises Other Exercises: B LE knee extension A/AROM to decrease stiffness   Shoulder Instructions      Home Living Family/patient expects to be discharged to:: Private residence Living Arrangements: Children Available Help at Discharge: Family;Available 24 hours/day Type of Home: House Home Access: Stairs to enter [employer] of Steps: 1   Home Layout: One level     Bathroom Shower/Tub: Chief Strategy Officer: Standard     Home Equipment: Grab bars - tub/shower;Rollator (4 wheels)          Prior Functioning/Environment Prior Level of Function : Independent/Modified Independent             Mobility Comments: no use of AD for mobility. Per PT's discussion with son, pt refuses to use walker ADLs Comments: Son reports pt has been washing up at the sink for "years" and she refuses to let him help her bathe, however she will allow her friend to come over and get her in the bath tub occasionally. Pt reports being stubborn and likes to do ADLs herself, reports she uses cook book and makes food in the kitchen        OT Problem List: Decreased strength;Decreased activity tolerance;Impaired balance (sitting and/or standing);Decreased cognition;Decreased safety awareness;Decreased knowledge  of use of DME or AE      OT Treatment/Interventions: Self-care/ADL training;Therapeutic exercise;Energy conservation;DME and/or AE instruction;Therapeutic activities;Patient/family education;Balance training    OT Goals(Current goals can be found in the care plan section) Acute Rehab OT Goals Patient  Stated Goal: have some breakfast, regain independence OT Goal Formulation: With patient Time For Goal Achievement: 03/26/22 Potential to Achieve Goals: Good  OT Frequency: Min 2X/week    Co-evaluation              AM-PAC OT "6 Clicks" Daily Activity     Outcome Measure Help from another person eating meals?: None Help from another person taking care of personal grooming?: A Little Help from another person toileting, which includes using toliet, bedpan, or urinal?: A Lot Help from another person bathing (including washing, rinsing, drying)?: A Lot Help from another person to put on and taking off regular upper body clothing?: A Little Help from another person to put on and taking off regular lower body clothing?: A Lot 6 Click Score: 16   End of Session Equipment Utilized During Treatment: Gait belt;Rolling walker (2 wheels) Nurse Communication: Other (comment) (checked in with NT to see if pt had had breakfast though they were unsure; called cafeteria instead)  Activity Tolerance: Patient tolerated treatment well Patient left: in chair;with call bell/phone within reach;with chair alarm set  OT Visit Diagnosis: Unsteadiness on feet (R26.81);Other abnormalities of gait and mobility (R26.89);Muscle weakness (generalized) (M62.81)                Time: 8119-1478 OT Time Calculation (min): 24 min Charges:  OT General Charges $OT Visit: 1 Visit OT Evaluation $OT Eval Moderate Complexity: 1 Mod OT Treatments $Self Care/Home Management : 8-22 mins  Bradd Canary, OTR/L Acute Rehab Services Office: [phone]   Lorre Munroe 03/12/2022, 9:31 AM

## 2022-03-12 NOTE — Progress Notes (Signed)
Mobility Specialist Progress Note   03/12/22 1500  Mobility  Activity Off unit;Transferred from bed to chair  Level of Assistance Maximum assist, patient does 25-49%  Assistive Device Wheelchair  Distance Ambulated (ft) 2 ft  Activity Response Tolerated fair  $Mobility charge 1 Mobility   NT requesting assistance to get pt into car for d/c once off the unit. Pt unable to SPT into car so MaxA to pick pt up and place in car. Left w/o fault or complaint.  Holland Falling Mobility Specialist Phone Number [phone]

## 2022-03-13 ENCOUNTER — Other Ambulatory Visit: Payer: Self-pay

## 2022-03-13 DIAGNOSIS — R5381 Other malaise: Secondary | ICD-10-CM | POA: Diagnosis not present

## 2022-03-13 DIAGNOSIS — I10 Essential (primary) hypertension: Secondary | ICD-10-CM | POA: Diagnosis not present

## 2022-03-13 DIAGNOSIS — F0390 Unspecified dementia without behavioral disturbance: Secondary | ICD-10-CM | POA: Diagnosis not present

## 2022-03-13 LAB — BASIC METABOLIC PANEL
Anion gap: 9 (ref 5–15)
BUN: 18 mg/dL (ref 8–23)
CO2: 23 mmol/L (ref 22–32)
Calcium: 8.4 mg/dL — ABNORMAL LOW (ref 8.9–10.3)
Chloride: 102 mmol/L (ref 98–111)
Creatinine, Ser: 0.69 mg/dL (ref 0.44–1.00)
GFR, Estimated: >60 mL/min (ref >60)
Glucose, Bld: 130 mg/dL — ABNORMAL HIGH (ref 70–99)
Potassium: 3.1 mmol/L — ABNORMAL LOW (ref 3.5–5.1)
Sodium: 134 mmol/L — ABNORMAL LOW (ref 135–145)

## 2022-03-13 LAB — CBC
HCT: 33.6 % — ABNORMAL LOW (ref 36.0–46.0)
Hemoglobin: 11.4 g/dL — ABNORMAL LOW (ref 12.0–15.0)
MCH: 31.2 pg (ref 26.0–34.0)
MCHC: 33.9 g/dL (ref 30.0–36.0)
MCV: 92.1 fL (ref 80.0–100.0)
Platelets: 182 10*3/uL (ref 150–400)
RBC: 3.65 MIL/uL — ABNORMAL LOW (ref 3.87–5.11)
RDW: 12.6 % (ref 11.5–15.5)
WBC: 13.8 10*3/uL — ABNORMAL HIGH (ref 4.0–10.5)
nRBC: 0 % (ref 0.0–0.2)

## 2022-03-13 LAB — URINE CULTURE: Culture: NO GROWTH

## 2022-03-13 LAB — LACTIC ACID, PLASMA: Lactic Acid, Venous: 1.1 mmol/L (ref 0.5–1.9)

## 2022-03-13 LAB — TROPONIN I (HIGH SENSITIVITY): Troponin I (High Sensitivity): 6 ng/L (ref <18)

## 2022-03-13 LAB — MAGNESIUM: Magnesium: 1.8 mg/dL (ref 1.7–2.4)

## 2022-03-13 MED ORDER — HYDRALAZINE HCL 20 MG/ML IJ SOLN
10.0000 mg | INTRAMUSCULAR | Status: DC | PRN
  Administered 2022-03-15: 10 mg via INTRAVENOUS
  Filled 2022-03-13: qty 1

## 2022-03-13 MED ORDER — POTASSIUM CHLORIDE CRYS ER 20 MEQ PO TBCR
40.0000 meq | EXTENDED_RELEASE_TABLET | ORAL | Status: AC
  Administered 2022-03-13 (×2): 40 meq via ORAL
  Filled 2022-03-13 (×2): qty 2

## 2022-03-13 NOTE — Progress Notes (Signed)
Progress Note   Patient: [name] XOV:[id number] DOB: Feb 25, 1930 DOA: 03/12/2022     0 DOS: the patient was seen and examined on 03/13/2022   Brief hospital course: 88 y.o. female with medical history significant for dementia, hypertension, hyperlipidemia, GERD, and recent admission for UTI with acute encephalopathy, now presenting to the emergency department with general weakness.  Patient was admitted to the hospital on 03/09/2022 with UTI and acute encephalopathy.  She was requiring intensive assistance with all aspects of her mobility but family wanted the patient back home and she was discharged home with home health earlier today.  Patient's daughter [name]) brought her home from the hospital but the patient required 2 person assist to get inside the house, was unable to ambulate independently like she had been prior to the recent hospitalization, and family had her sent back to the hospital for placement in a rehab facility.  Assessment and Plan: No notes have been filed under this hospital service. Service: Hospitalist 1. Physical deconditioning  - Pt was discharged from the hospital earlier today after admission for UTI with acute encephalopathy, but was sent back when she was too generally weak to get inside without intensive assistance from her 2 children  - Per PT note just prior to discharge, "Pt continues to require heavy assist for all aspects of mobility"  - Family is now interested in SNF placement   -PT/OT consulted, TOC consulted   2. SIRS  - Leukocytosis and mild tachycardia noted in ED without fever, hypotension, and elevated lactate  - Blood and urine cultures were collected in ED, 30 cc/kg LR bolus was given, and she was treated with vancomycin, cefepime, and Flagyl  - She does not appear to be septic  - She was treated with Rocephin for UTI in hospital starting 5/19; blood cultures were negative from 5/19 and urine grew multiple species  - Blood cx thus far neg <24h. Urine  cx remains pending. Cont on empiric cefepime for now pending cx -Repeat cbc in AM   3. Hypertension  - Continue lisinopril   -BP stable, albeit suboptimally controlled -Will order PRN hydralazine   4. Hypokalemia   - Remains low at 3.1 -Will replace -Repeat bmet in AM   5. Dementia  - Noted to be at baseline state at time of presentation - Delirium precautions    6. Fluid collection in uterine fundus  - Noted incidentally on CT in ED, significance unclear  - Could not be assessed with transabdominal US, could consider removing pessary and checking endovaginal [place]         Subjective: Pleasant, without complaints this AM  Physical Exam: Vitals:   03/13/22 0525 03/13/22 0848 03/13/22 0955 03/13/22 1224  BP: (!) 155/65 (!) 165/69 (!) 150/61 (!) 147/118  Pulse: 94 (!) 107  98  Resp: 19 16  16   Temp: 98.3 F (36.8 C) 97.8 F (36.6 C)  98.5 F (36.9 C)  TempSrc: Oral     SpO2: 95% 99%  100%  Weight:      Height:       General exam: Awake, laying in bed, in nad Respiratory system: Normal respiratory effort, no wheezing Cardiovascular system: regular rate, s1, s2 Gastrointestinal system: Soft, nondistended, positive BS Central nervous system: CN2-12 grossly intact, strength intact Extremities: Perfused, no clubbing Skin: Normal skin turgor, no notable skin lesions seen Psychiatry: Mood normal // no visual hallucinations   Data Reviewed:  Labs reviewed: K 3.1, Cr 0.69, WBC 13.8, Hgb 11.4  Family Communication: Pt in room, family not at bedside  Disposition: Status is: Observation The patient remains OBS appropriate and will d/c before 2 midnights.  Planned Discharge Destination: Skilled nursing facility     Author: Rickey Barbara, MD 03/13/2022 1:29 PM  For on call review [email or web address].

## 2022-03-13 NOTE — Hospital Course (Signed)
88 y.o. female with medical history significant for dementia, hypertension, hyperlipidemia, GERD, and recent admission for UTI with acute encephalopathy, now presenting to the emergency department with general weakness.  Patient was admitted to the hospital on 03/09/2022 with UTI and acute encephalopathy.  She was requiring intensive assistance with all aspects of her mobility but family wanted the patient back home and she was discharged home with home health earlier today.  Patient's daughter [name]) brought her home from the hospital but the patient required 2 person assist to get inside the house, was unable to ambulate independently like she had been prior to the recent hospitalization, and family had her sent back to the hospital for placement in a rehab facility.

## 2022-03-13 NOTE — TOC Initial Note (Signed)
Transition of Care [facility]) - Initial/Assessment Note   Patient Details  Name: [name] MRN: [id number] Date of Birth: January 30, 1930  [facility]) CM/SW Contact:    Ewing Schlein, LCSW Phone Number: 03/13/2022, 2:42 PM  Clinical Narrative: PT evaluation recommended SNF. Son is agreeable to rehab. FL2 done; PASRR received. Initial referral faxed out. TOC awaiting bed offers.  Expected Discharge Plan: Skilled Nursing Facility Barriers to Discharge: Continued Medical Work up, SNF Pending bed offer, Insurance Authorization  Patient Goals and CMS Choice Patient states their goals for this hospitalization and ongoing recovery are:: Go to short-term rehab CMS Medicare.gov Compare Post Acute Care list provided to:: Patient Represenative (must comment) Choice offered to / list presented to : Adult Children  Expected Discharge Plan and Services Expected Discharge Plan: Skilled Nursing Facility In-house Referral: Clinical Social Work Post Acute Care Choice: Skilled Nursing Facility Living arrangements for the past 2 months: Independent Living Facility            DME Arranged: N/A DME Agency: NA  Prior Living Arrangements/Services Living arrangements for the past 2 months: Independent Living Facility Patient language and need for interpreter reviewed:: Yes Do you feel safe going back to the place where you live?: Yes      Need for Family Participation in Patient Care: Yes (Comment) Care giver support system in place?: Yes (comment) Criminal Activity/Legal Involvement Pertinent to Current Situation/Hospitalization: No - Comment as needed  Activities of Daily Living Home Assistive Devices/Equipment: Eyeglasses ADL Screening (condition at time of admission) Patient's cognitive ability adequate to safely complete daily activities?: No Is the patient deaf or have difficulty hearing?: No Does the patient have difficulty seeing, even when wearing glasses/contacts?: No Does the patient  have difficulty concentrating, remembering, or making decisions?: Yes Patient able to express need for assistance with ADLs?: Yes Does the patient have difficulty dressing or bathing?: No Independently performs ADLs?: Yes (appropriate for developmental age) Does the patient have difficulty walking or climbing stairs?: Yes Weakness of Legs: Both Weakness of Arms/Hands: Both  Permission Sought/Granted Permission sought to share information with : Facility Industrial/product designer granted to share information with : Yes, Verbal Permission Granted Permission granted to share info w AGENCY: SNFs  Emotional Assessment Appearance:: Appears stated age Orientation: : Fluctuating Orientation (Suspected and/or reported Sundowners) Alcohol / Substance Use: Tobacco Use Psych Involvement: No (comment)  Admission diagnosis:  Acute encephalopathy [G93.40] Urinary tract infection without hematuria, site unspecified [N39.0] Sepsis, due to unspecified organism, unspecified whether acute organ dysfunction present [facility]) [A41.9] Patient Active Problem List   Diagnosis Date Noted   SIRS (systemic inflammatory response syndrome) (HCC) 03/12/2022   Abnormal collection of fluid in uterine cavity 03/12/2022   Acute cystitis without hematuria 03/10/2022   Dementia without behavioral disturbance (HCC) 03/10/2022   GERD without esophagitis 03/10/2022   Physical deconditioning 11/17/2019   UTI (urinary tract infection) 11/17/2019   Anemia 11/17/2019   Dehydration 01/16/2014   L1 vertebral fracture (HCC) 01/13/2014   Protein-calorie malnutrition, severe (HCC) 03/24/2013   Hypokalemia 02/17/2013   Essential hypertension 02/17/2013   Mixed hyperlipidemia 02/17/2013   PCP:  Kaleen Mask, MD Pharmacy:   [id number] - , [place] - [address] [place] [address] Phone: [phone] Fax: [phone]  Readmission Risk Interventions     View : No data  to display.

## 2022-03-13 NOTE — NC FL2 (Signed)
[place] MEDICAID FL2 LEVEL OF CARE SCREENING TOOL     IDENTIFICATION  Patient Name: [name] Birthdate: 1930-01-15 Sex: female Admission Date (Current Location): 03/12/2022  [facility] [place] Number:  Producer, television/film/video and Address:  [facility],  [address], [place] [address]      Provider Number: [id number]  Attending Physician Name and Address:  Jerald Kief, MD  Relative Name and Phone Number:  Vasiliki Smaldone (son) Ph: [phone]    Current Level of Care: Hospital Recommended Level of Care: Skilled Nursing Facility Prior Approval Number:    Date Approved/Denied:   PASRR Number: [id number]  Discharge Plan: SNF    Current Diagnoses: Patient Active Problem List   Diagnosis Date Noted   SIRS (systemic inflammatory response syndrome) (HCC) 03/12/2022   Abnormal collection of fluid in uterine cavity 03/12/2022   Acute cystitis without hematuria 03/10/2022   Dementia without behavioral disturbance (HCC) 03/10/2022   GERD without esophagitis 03/10/2022   Physical deconditioning 11/17/2019   UTI (urinary tract infection) 11/17/2019   Anemia 11/17/2019   Dehydration 01/16/2014   L1 vertebral fracture (HCC) 01/13/2014   Protein-calorie malnutrition, severe (HCC) 03/24/2013   Hypokalemia 02/17/2013   Essential hypertension 02/17/2013   Mixed hyperlipidemia 02/17/2013    Orientation RESPIRATION BLADDER Height & Weight     Self, Situation, Place  Normal Incontinent Weight: 92 lb 2.4 oz (41.8 kg) Height:  5\' 2"  (157.5 cm)  BEHAVIORAL SYMPTOMS/MOOD NEUROLOGICAL BOWEL NUTRITION STATUS   (N/A)  (N/A) Continent Diet (Regular diet)  AMBULATORY STATUS COMMUNICATION OF NEEDS Skin   Extensive Assist Verbally Other (Comment) (Ecchymosis: left back)                       Personal Care Assistance Level of Assistance  Bathing, Feeding, Dressing Bathing Assistance: Limited assistance Feeding assistance: Independent Dressing Assistance: Limited  assistance     Functional Limitations Info  Sight, Hearing, Speech Sight Info: Impaired Hearing Info: Adequate Speech Info: Adequate    SPECIAL CARE FACTORS FREQUENCY  PT (By licensed PT), OT (By licensed OT)     PT Frequency: 5x's/week OT Frequency: 5x's/week            Contractures Contractures Info: Not present    Additional Factors Info  Code Status, Allergies Code Status Info: Full Allergies Info: NKA           Current Medications (03/13/2022):  This is the current hospital active medication list Current Facility-Administered Medications  Medication Dose Route Frequency Provider Last Rate Last Admin   acetaminophen (TYLENOL) tablet 650 mg  650 mg Oral Q6H PRN Opyd, 03/15/2022, MD       Or   acetaminophen (TYLENOL) suppository 650 mg  650 mg Rectal Q6H PRN Opyd, Lavone Neri, MD       ceFEPIme (MAXIPIME) 2 g in sodium chloride 0.9 % 100 mL IVPB  2 g Intravenous Q24H Poindexter, Leann T, RPH       enoxaparin (LOVENOX) injection 30 mg  30 mg Subcutaneous Q24H Opyd, Lavone Neri, MD   30 mg at 03/13/22 0844   feeding supplement (ENSURE ENLIVE / ENSURE PLUS) liquid 237 mL  237 mL Oral BID BM Opyd, 03/15/22, MD   237 mL at 03/13/22 1320   hydrALAZINE (APRESOLINE) injection 10 mg  10 mg Intravenous Q4H PRN 03/15/22, MD       lisinopril (ZESTRIL) tablet 10 mg  10 mg Oral QHS Opyd, Jerald Kief, MD  10 mg at 03/12/22 2343   pantoprazole (PROTONIX) EC tablet 40 mg  40 mg Oral Daily Opyd, Lavone Neri, MD   40 mg at 03/13/22 0845   potassium chloride SA (KLOR-CON M) CR tablet 40 mEq  40 mEq Oral Q4H Jerald Kief, MD   40 mEq at 03/13/22 1402   senna-docusate (Senokot-S) tablet 1 tablet  1 tablet Oral QHS PRN Opyd, Lavone Neri, MD       traMADol (ULTRAM) tablet 50 mg  50 mg Oral Q48H PRN Opyd, Lavone Neri, MD         Discharge Medications: Please see discharge summary for a list of discharge medications.  Relevant Imaging Results:  Relevant Lab Results:   Additional  Information SSN: [id number]  Ewing Schlein, LCSW

## 2022-03-13 NOTE — Evaluation (Signed)
Physical Therapy Evaluation Patient Details Name: [name] MRN: [id number] DOB: 22-Oct-1930 Today's Date: 03/13/2022  History of Present Illness  Pt is a 88yo female presenting [facility] on 5/22 secondary to general weakness. Pt had recent admit to [facility] on 5/19 secondary to UTI with acute encephalopathy and was discharged 5/22 and per H&P pt's family was unable to assist pt inside the house and returned her to ED. Imaging revealed chronic compression fxs T12-L2 and age-indeterminate compression fx L4.  PMH: dementia, HTN, HLD, GERD, cardiomegaly, kyphoplasty 2015,  Clinical Impression  [name] is a 88yo female presenting with the problems listed above and functional impairments listed below. Pt is A&Ox1 only to self and is presenting with generalized weakness of all extremities. Required min assist +1 for bed mobility with multimodal cuing for sequencing. Pt required Stedy to complete transfers for safety secondary to weakness and despite use of lift equipment still required mod assist +2 to come to standing position, demonstrated great degree of kyphosis in standing. Pt reporting she feels weaker than her baseline, discussed SNF-level therapies with pt and she is agreeable. We will continue to follow the pt acutely to promote safe discharge to the destination indicated below.    Recommendations for follow up therapy are one component of a multi-disciplinary discharge planning process, led by the attending physician.  Recommendations may be updated based on patient status, additional functional criteria and insurance authorization.  Follow Up Recommendations Skilled nursing-short term rehab (<3 hours/day)    Assistance Recommended at Discharge Frequent or constant Supervision/Assistance  Patient can return home with the following  A lot of help with walking and/or transfers;A lot of help with bathing/dressing/bathroom;Assistance with cooking/housework;Direct supervision/assist for medications  management;Direct supervision/assist for financial management;Assist for transportation;Help with stairs or ramp for entrance    Equipment Recommendations None recommended by PT (TBD at SNF)  Recommendations for Other Services       Functional Status Assessment Patient has had a recent decline in their functional status and demonstrates the ability to make significant improvements in function in a reasonable and predictable amount of time.     Precautions / Restrictions Precautions Precautions: Fall Precaution Comments: pt reports clumsiness Restrictions Weight Bearing Restrictions: No      Mobility  Bed Mobility Overal bed mobility: Needs Assistance Bed Mobility: Sit to Supine     Supine to sit: Min assist, HOB elevated     General bed mobility comments: Pt required min assist+2 to elevate trunk and bring BLEs off bed, unable to initiate movement of LLE off bed but can move RLE. Multimodal cuing for hand placement and use of bed rails.    Transfers Overall transfer level: Needs assistance Equipment used: Ambulation equipment used Charlaine Dalton) Transfers: Sit to/from Stand, Bed to chair/wheelchair/BSC Sit to Stand: Mod assist, +2 physical assistance, +2 safety/equipment (Use of STEDY)           General transfer comment: Pt required mod assist +2 for standing using Stedy on first attempt, sat down per pt request to adjust gait belt. Upon second attempt, pt min assist +2 using Stedy and was more stable. Pt transferred from bed to chair using Stedy. Pt completed third sit to stand transfer using BUEs to pull up on stedy and required min guard, no physical assist and was able to safely lower to recliner seat. Transfer via Lift Equipment: Stedy  Ambulation/Gait               General Gait Details: Unsafe to progress  to gait training this date secondary to pt's generalized weakness.  Stairs            Wheelchair Mobility    Modified Rankin (Stroke Patients Only)        Balance Overall balance assessment: Needs assistance Sitting-balance support: Feet supported, No upper extremity supported Sitting balance-Leahy Scale: Fair     Standing balance support: Bilateral upper extremity supported, During functional activity, Reliant on assistive device for balance Standing balance-Leahy Scale: Zero Standing balance comment: Pt required use of Stedy, BUE and +2 for standing.                             Pertinent Vitals/Pain Pain Assessment Pain Assessment: No/denies pain    Home Living Family/patient expects to be discharged to:: Private residence Living Arrangements: Spouse/significant other;Children (2 daughters, husband) Available Help at Discharge: Family;Available 24 hours/day Type of Home: House Home Access: Stairs to enter   [employer] of Steps: 1   Home Layout: One level Home Equipment: Grab bars - tub/shower;Rollator (4 wheels) Additional Comments: Home environment from previous visit, confirmed via pt.    Prior Function Prior Level of Function : Independent/Modified Independent             Mobility Comments: Pt reports no use of AD for mobility. Per previous PT note, pt's son reports pt refuses to use AD. ADLs Comments: Pt reports she takes tub baths herself, but per previous PT note pt has been sponge bathing for years.     Hand Dominance   Dominant Hand: Right    Extremity/Trunk Assessment   Upper Extremity Assessment Upper Extremity Assessment: Generalized weakness;RUE deficits/detail;LUE deficits/detail RUE Deficits / Details: Gross strength 3/5, functional ROM RUE Sensation: WNL LUE Deficits / Details: Gross strength 3/5, functional ROM LUE Sensation: WNL    Lower Extremity Assessment Lower Extremity Assessment: Generalized weakness;RLE deficits/detail;LLE deficits/detail RLE Deficits / Details: Gross strength 3/5, functional ROM RLE Sensation: WNL LLE Deficits / Details: Gross strength  3/5, functional ROM LLE Sensation: WNL    Cervical / Trunk Assessment Cervical / Trunk Assessment: Kyphotic  Communication   Communication: No difficulties  Cognition Arousal/Alertness: Awake/alert Behavior During Therapy: Flat affect, WFL for tasks assessed/performed Overall Cognitive Status: No family/caregiver present to determine baseline cognitive functioning                                 General Comments: dementia at baseline. Pt is able to follow one-step commands consistently. Pt oriented to self, states she is in [place], doesn't know the month, and is unable to name the president (AOx1). No family member present to confirm baseline.        General Comments General comments (skin integrity, edema, etc.): Bp at start of session 150/61    Exercises     Assessment/Plan    PT Assessment Patient needs continued PT services  PT Problem List Decreased strength;Decreased range of motion;Decreased activity tolerance;Decreased balance;Decreased mobility;Decreased coordination;Decreased cognition;Decreased safety awareness       PT Treatment Interventions DME instruction;Gait training;Functional mobility training;Therapeutic activities;Therapeutic exercise;Balance training;Patient/family education;Neuromuscular re-education;Cognitive remediation    PT Goals (Current goals can be found in the Care Plan section)  Acute Rehab PT Goals Patient Stated Goal: Home today PT Goal Formulation: With patient Time For Goal Achievement: 03/27/22 Potential to Achieve Goals: Fair    Frequency Min 2X/week     Co-evaluation  AM-PAC PT "6 Clicks" Mobility  Outcome Measure Help needed turning from your back to your side while in a flat bed without using bedrails?: A Little Help needed moving from lying on your back to sitting on the side of a flat bed without using bedrails?: A Lot Help needed moving to and from a bed to a chair (including a  wheelchair)?: Total Help needed standing up from a chair using your arms (e.g., wheelchair or bedside chair)?: Total Help needed to walk in hospital room?: Total Help needed climbing 3-5 steps with a railing? : Total 6 Click Score: 9    End of Session Equipment Utilized During Treatment: Gait belt Activity Tolerance: Patient limited by fatigue (weakness) Patient left: in chair;with call bell/phone within reach;with chair alarm set Nurse Communication: Mobility status PT Visit Diagnosis: Difficulty in walking, not elsewhere classified (R26.2);Muscle weakness (generalized) (M62.81);Unsteadiness on feet (R26.81)    Time: [id number] PT Time Calculation (min) (ACUTE ONLY): 21 min   Charges:   PT Evaluation $PT Eval Low Complexity: 1 Low          Coolidge Breeze, PT, DPT [facility] Rehabilitation Department Office: [phone] Pager: [phone]  Coolidge Breeze 03/13/2022, 10:26 AM

## 2022-03-14 DIAGNOSIS — N390 Urinary tract infection, site not specified: Secondary | ICD-10-CM

## 2022-03-14 DIAGNOSIS — N858 Other specified noninflammatory disorders of uterus: Secondary | ICD-10-CM

## 2022-03-14 DIAGNOSIS — I10 Essential (primary) hypertension: Secondary | ICD-10-CM | POA: Diagnosis not present

## 2022-03-14 DIAGNOSIS — R5381 Other malaise: Secondary | ICD-10-CM | POA: Diagnosis not present

## 2022-03-14 DIAGNOSIS — F0390 Unspecified dementia without behavioral disturbance: Secondary | ICD-10-CM | POA: Diagnosis not present

## 2022-03-14 LAB — COMPREHENSIVE METABOLIC PANEL
ALT: 37 U/L (ref 0–44)
AST: 40 U/L (ref 15–41)
Albumin: 2.9 g/dL — ABNORMAL LOW (ref 3.5–5.0)
Alkaline Phosphatase: 75 U/L (ref 38–126)
Anion gap: 7 (ref 5–15)
BUN: 22 mg/dL (ref 8–23)
CO2: 27 mmol/L (ref 22–32)
Calcium: 9.1 mg/dL (ref 8.9–10.3)
Chloride: 106 mmol/L (ref 98–111)
Creatinine, Ser: 0.69 mg/dL (ref 0.44–1.00)
GFR, Estimated: >60 mL/min (ref >60)
Glucose, Bld: 126 mg/dL — ABNORMAL HIGH (ref 70–99)
Potassium: 4 mmol/L (ref 3.5–5.1)
Sodium: 140 mmol/L (ref 135–145)
Total Bilirubin: 0.9 mg/dL (ref 0.3–1.2)
Total Protein: 6.7 g/dL (ref 6.5–8.1)

## 2022-03-14 LAB — CBC
HCT: 32.7 % — ABNORMAL LOW (ref 36.0–46.0)
Hemoglobin: 10.9 g/dL — ABNORMAL LOW (ref 12.0–15.0)
MCH: 30.4 pg (ref 26.0–34.0)
MCHC: 33.3 g/dL (ref 30.0–36.0)
MCV: 91.3 fL (ref 80.0–100.0)
Platelets: 197 10*3/uL (ref 150–400)
RBC: 3.58 MIL/uL — ABNORMAL LOW (ref 3.87–5.11)
RDW: 12.6 % (ref 11.5–15.5)
WBC: 10.1 10*3/uL (ref 4.0–10.5)
nRBC: 0 % (ref 0.0–0.2)

## 2022-03-14 MED ORDER — AMLODIPINE BESYLATE 5 MG PO TABS
5.0000 mg | ORAL_TABLET | Freq: Every day | ORAL | Status: DC
Start: 2022-03-14 — End: 2022-03-16
  Administered 2022-03-14 – 2022-03-15 (×2): 5 mg via ORAL
  Filled 2022-03-14 (×2): qty 1

## 2022-03-14 NOTE — Progress Notes (Signed)
Physical Therapy Treatment Patient Details Name: [name] MRN: [id number] DOB: 15-Mar-1930 Today's Date: 03/14/2022   History of Present Illness Pt is a 88yo female presenting to [facility] ED on 5/22 secondary to general weakness. Pt had recent admit to [facility] on 5/19 secondary to UTI with acute encephalopathy and was discharged 5/22 and per H&P pt's family was unable to assist pt inside the house and returned her to ED. Imaging revealed chronic compression fxs T12-L2 and age-indeterminate compression fx L4.  PMH: dementia, HTN, HLD, GERD, cardiomegaly, kyphoplasty 2015,    PT Comments    Pt sleeping upon entry but agreeable to be seen. Pt required mod assist +2 for bed mobility, mod assist +2 for transfer bed to chair using Stedy lift device. Pt was able to dangle EOB without PT assistance and single UE support for but reporting fatigue. Pt completed general LE exercises in small range of motion and encouraged pt to keep moving extremities to stay strong and mobile. Pt is weaker today than previous session with this therapist and is requiring increased levels of assist. Lunch tray arrive during session and pt reporting "I already had lunch, they feed me too much," encouraged pt to eat when ready so that she can maintain her strength. Discharge destination remains appropriate. We will continue to follow her acutely.    Recommendations for follow up therapy are one component of a multi-disciplinary discharge planning process, led by the attending physician.  Recommendations may be updated based on patient status, additional functional criteria and insurance authorization.  Follow Up Recommendations  Skilled nursing-short term rehab (<3 hours/day)     Assistance Recommended at Discharge Frequent or constant Supervision/Assistance  Patient can return home with the following A lot of help with walking and/or transfers;A lot of help with bathing/dressing/bathroom;Assistance with cooking/housework;Direct  supervision/assist for medications management;Direct supervision/assist for financial management;Assist for transportation;Help with stairs or ramp for entrance   Equipment Recommendations  None recommended by PT (TBD at SNF)    Recommendations for Other Services       Precautions / Restrictions Precautions Precautions: Fall Precaution Comments: pt reports clumsiness Restrictions Weight Bearing Restrictions: No     Mobility  Bed Mobility Overal bed mobility: Needs Assistance Bed Mobility: Sit to Supine     Supine to sit: HOB elevated, Mod assist, +2 for physical assistance     General bed mobility comments: Pt required mod assist+2 for physical assist to elevate trunk and bring BLEs off bed. Pt unable to iniate movement of BLE but can move BUE, did provide BUE assist via bed rails.    Transfers Overall transfer level: Needs assistance Equipment used: Ambulation equipment used Antony Salmon) Transfers: Sit to/from Stand, Bed to chair/wheelchair/BSC Sit to Stand: Mod assist, +2 physical assistance, +2 safety/equipment (Use of STEDY) Stand pivot transfers: Mod assist, +2 safety/equipment, +2 physical assistance         General transfer comment: Pt required mod assist +2 for standing using Stedy. Pt transferred from bed to chair using Stedy, pt able to maintain trunk upright using BUE support on Stedy. Pt able safely lower to recliner seat with min guard. Transfer via Lift Equipment: Stedy  Ambulation/Gait               General Gait Details: Unsafe to progress to gait training this date secondary to pt's generalized weakness.   Stairs             Wheelchair Mobility    Modified Rankin (Stroke Patients Only)  Balance Overall balance assessment: Needs assistance Sitting-balance support: Feet supported, Single extremity supported Sitting balance-Leahy Scale: Poor Sitting balance - Comments: pt reliant on single UE support on bed rail   Standing balance  support: Bilateral upper extremity supported, During functional activity, Reliant on assistive device for balance Standing balance-Leahy Scale: Zero Standing balance comment: Pt required use of Stedy, BUE and +2 for standing.                            Cognition Arousal/Alertness: Awake/alert Behavior During Therapy: Flat affect, WFL for tasks assessed/performed Overall Cognitive Status: No family/caregiver present to determine baseline cognitive functioning                                 General Comments: dementia at baseline. Pt is able to follow one-step commands consistently. Pt AOx4        Exercises General Exercises - Lower Extremity Ankle Circles/Pumps: AROM, Both, 10 reps, Seated Heel Slides: AROM, Both, 10 reps, Seated    General Comments        Pertinent Vitals/Pain Pain Assessment Pain Assessment: No/denies pain    Home Living                          Prior Function            PT Goals (current goals can now be found in the care plan section) Acute Rehab PT Goals Patient Stated Goal: Home today PT Goal Formulation: With patient Time For Goal Achievement: 03/27/22 Potential to Achieve Goals: Fair Progress towards PT goals: Not progressing toward goals - comment (Pt is weaker today)    Frequency    Min 2X/week      PT Plan Current plan remains appropriate    Co-evaluation              AM-PAC PT "6 Clicks" Mobility   Outcome Measure  Help needed turning from your back to your side while in a flat bed without using bedrails?: A Lot Help needed moving from lying on your back to sitting on the side of a flat bed without using bedrails?: A Lot Help needed moving to and from a bed to a chair (including a wheelchair)?: Total Help needed standing up from a chair using your arms (e.g., wheelchair or bedside chair)?: Total Help needed to walk in hospital room?: Total Help needed climbing 3-5 steps with a railing?  : Total 6 Click Score: 8    End of Session Equipment Utilized During Treatment: Gait belt Activity Tolerance: Patient limited by fatigue (weakness) Patient left: in chair;with call bell/phone within reach;with chair alarm set Nurse Communication: Mobility status PT Visit Diagnosis: Difficulty in walking, not elsewhere classified (R26.2);Muscle weakness (generalized) (M62.81);Unsteadiness on feet (R26.81)     Time: 8264-1583 PT Time Calculation (min) (ACUTE ONLY): 18 min  Charges:  $Therapeutic Activity: 8-22 mins                     Jamesetta Geralds, PT, DPT [facility] Department Office: [phone] Pager: [phone]   Jamesetta Geralds 03/14/2022, 1:55 PM

## 2022-03-14 NOTE — Progress Notes (Signed)
[facility]  PROGRESS NOTE  [name] HYI:[id number] DOB: 06/11/1930 DOA: 03/12/2022 PCP: Kaleen Mask, MD   Brief HPI:   88 y.o. female with medical history significant for dementia, hypertension, hyperlipidemia, GERD, and recent admission for UTI with acute encephalopathy, now presenting to the emergency department with general weakness.  Patient was admitted to the hospital on 03/09/2022 with UTI and acute encephalopathy.  She was requiring intensive assistance with all aspects of her mobility but family wanted the patient back home and she was discharged home with home health earlier today.  Patient's daughter [name]) brought her home from the hospital but the patient required 2 person assist to get inside the house, was unable to ambulate independently like she had been prior to the recent hospitalization, and family had her sent back to the hospital for placement in a rehab facility.    Subjective   Patient seen and examined, denies any complaints.   Assessment/Plan:    Physical deconditioning -Patient was discharged from the hospital yesterday after she was treated for UTI with acute encephalopathy -She was sent back to the hospital as she was too weak to get inside the house without assistance from 2 children -PT was consulted -Patient to go to skilled nursing facility  SIRS -Patient presented with leukocytosis and mild tachycardia -Denies fever, hypotension, also had increased lactate -Blood and urine culture obtained in the ED -Patient started on LR at 30 cc/kg -Patient does not appear to be septic -She was treated with Rocephin for UTI in hospital starting 5/19, blood cultures were negative from 5/19 and urine culture grew multiple species -Blood cultures negative to date, urine culture showed no growth -Patient was started on empiric antibiotic including cefepime -We will discontinue cefepime at this time. -WBC is down to 10,000  Hypertension -Blood  pressure is elevated -Continue lisinopril; will add amlodipine 5 mg daily -Continue as needed hydralazine  Hypokalemia -Replete  Dementia -At baseline -No behavior disturbance  Abnormal fluid collection in the uterine fundus -Noted incidentally on CT chest abdomen/pelvis on 03/12/2022 -Transabdominal ultrasound obtained which did not show any fluid collection -Consider endovaginal ultrasound as outpatient after pessary removal  Chronic left hydronephrosis and hydroureter -Seen on CT abdomen/pelvis -Likely secondary to severe pelvic floor relaxation and large cystocele, rectocele and uterine prolapse -Renal function is normal -Follow-up urology as outpatient    Medications     enoxaparin (LOVENOX) injection  30 mg Subcutaneous Q24H   feeding supplement  237 mL Oral BID BM   lisinopril  10 mg Oral QHS   pantoprazole  40 mg Oral Daily     Data Reviewed:   CBG:  No results for input(s): GLUCAP in the last 168 hours.  SpO2: 99 %    Vitals:   03/13/22 1224 03/13/22 2115 03/14/22 0500 03/14/22 1323  BP: (!) 147/100 (!) 174/71 (!) 148/75 (!) 174/75  Pulse: 98 100 88 100  Resp: 16 15 16 18   Temp: 98.5 F (36.9 C) 98.2 F (36.8 C) 98.7 F (37.1 C) 98.4 F (36.9 C)  TempSrc:  Oral Oral Oral  SpO2: 100% 98% 99% 99%  Weight:      Height:          Data Reviewed:  Basic Metabolic Panel: Recent Labs  Lab 03/10/22 0020 03/10/22 0901 03/12/22 0411 03/12/22 1744 03/13/22 0033 03/14/22 0418  NA 136 138 136 137 134* 140  K 3.5 3.0* 3.5 3.4* 3.1* 4.0  CL 102 102 103 102 102 106  CO2  22 27 24 26 23 27   GLUCOSE 131* 119* 135* 175* 130* 126*  BUN 16 10 21  25* 18 22  CREATININE 0.71 0.68 0.81 0.85 0.69 0.69  CALCIUM 9.3 9.3 9.1 9.2 8.4* 9.1  MG 1.9 1.8  --  2.1 1.8  --     CBC: Recent Labs  Lab 03/10/22 0020 03/10/22 0901 03/12/22 0411 03/12/22 1744 03/13/22 0033 03/14/22 0418  WBC 11.9* 10.6* 12.6* 15.3* 13.8* 10.1  NEUTROABS 9.9* 8.7* 10.3* 13.9*  --    --   HGB 12.7 12.5 12.4 11.5* 11.4* 10.9*  HCT 38.7 37.3 36.4 34.4* 33.6* 32.7*  MCV 92.6 90.8 89.4 90.5 92.1 91.3  PLT 179 174 195 211 182 197    LFT Recent Labs  Lab 03/10/22 0020 03/10/22 0901 03/12/22 1744 03/14/22 0418  AST 17 18 15  40  ALT 10 9 11  37  ALKPHOS 76 75 72 75  BILITOT 1.4* 0.8 1.1 0.9  PROT 7.5 7.4 7.6 6.7  ALBUMIN 3.9 3.7 3.4* 2.9*     Antibiotics: Anti-infectives (From admission, onward)    Start     Dose/Rate Route Frequency Ordered Stop   03/13/22 2100  ceFEPIme (MAXIPIME) 2 g in sodium chloride 0.9 % 100 mL IVPB        2 g 200 mL/hr over 30 Minutes Intravenous Every 24 hours 03/12/22 2227     03/12/22 1930  vancomycin (VANCOREADY) IVPB 750 mg/150 mL        750 mg 150 mL/hr over 60 Minutes Intravenous  Once 03/12/22 1915 03/12/22 2228   03/12/22 1915  ceFEPIme (MAXIPIME) 2 g in sodium chloride 0.9 % 100 mL IVPB        2 g 200 mL/hr over 30 Minutes Intravenous  Once 03/12/22 1904 03/12/22 2114   03/12/22 1915  metroNIDAZOLE (FLAGYL) IVPB 500 mg        500 mg 100 mL/hr over 60 Minutes Intravenous  Once 03/12/22 1904 03/12/22 2112   03/12/22 1915  vancomycin (VANCOCIN) IVPB 1000 mg/200 mL premix  Status:  Discontinued        1,000 mg 200 mL/hr over 60 Minutes Intravenous  Once 03/12/22 1904 03/12/22 1915        DVT prophylaxis: Lovenox  Code Status: Full code  Family Communication: No family at bedside   CONSULTS    Objective    Physical Examination:   General-appears in no acute distress Heart-S1-S2, regular, no murmur auscultated Lungs-clear to auscultation bilaterally, no wheezing or crackles auscultated Abdomen-soft, nontender, no organomegaly Extremities-no edema in the lower extremities Neuro-alert, oriented x3, no focal deficit noted  Status is: Inpatient:          [name] S Taleya Whitcher   [facility] If 7PM-7AM, please contact night-coverage at [email or web address], Office  [phone]   03/14/2022, 3:30 PM  LOS: 0  days

## 2022-03-14 NOTE — TOC Progression Note (Signed)
Transition of Care [facility]) - Progression Note   Patient Details  Name: Emerlyn Alberta MRN: [id number] Date of Birth: [birth year]  Transition of Care [facility]) CM/SW [place], LCSW Phone Number: 03/14/2022, 2:12 PM  Clinical Narrative: CSW provided bed offers to daughter, Laurin Coder, and family requested [place]. Patient is vaccinated for COVID x3. CSW confirmed bed with Claiborne Billings in admissions at [facility], which will be semiprivate. CSW completed insurance authorization in [place] portal. Reference ID # is: [id number]. Patient has been approved for 03/15/2022-03/20/2022. CSW updated [employer]. TOC to follow.  Expected Discharge Plan: [place] Barriers to Discharge: Continued Medical Work up, SNF Pending bed offer, Ship broker  Expected Discharge Plan and Services Expected Discharge Plan: [place] In-house Referral: Clinical Social Work Post Acute Care Choice: [place] Living arrangements for the past 2 months: [place]           DME Arranged: N/A DME Agency: NA  Readmission Risk Interventions     View : No data to display.

## 2022-03-15 DIAGNOSIS — F0390 Unspecified dementia without behavioral disturbance: Secondary | ICD-10-CM | POA: Diagnosis not present

## 2022-03-15 DIAGNOSIS — E876 Hypokalemia: Secondary | ICD-10-CM | POA: Diagnosis not present

## 2022-03-15 DIAGNOSIS — R6510 Systemic inflammatory response syndrome (SIRS) of non-infectious origin without acute organ dysfunction: Secondary | ICD-10-CM | POA: Diagnosis not present

## 2022-03-15 DIAGNOSIS — R5381 Other malaise: Secondary | ICD-10-CM | POA: Diagnosis not present

## 2022-03-15 LAB — CULTURE, BLOOD (ROUTINE X 2)
Culture: NO GROWTH
Culture: NO GROWTH
Special Requests: ADEQUATE
Special Requests: ADEQUATE

## 2022-03-15 MED ORDER — ENSURE ENLIVE PO LIQD: 237.0000 mL | Freq: Two times a day (BID) | ORAL | 12 refills | Status: AC

## 2022-03-15 MED ORDER — SENNOSIDES-DOCUSATE SODIUM 8.6-50 MG PO TABS: 1.0000 | ORAL_TABLET | Freq: Every evening | ORAL | Status: AC | PRN

## 2022-03-15 MED ORDER — TRAMADOL HCL 50 MG PO TABS: 50.0000 mg | ORAL_TABLET | ORAL | 0 refills | Status: AC | PRN

## 2022-03-15 MED ORDER — AMLODIPINE BESYLATE 10 MG PO TABS: 10.0000 mg | ORAL_TABLET | Freq: Every day | ORAL | Status: AC

## 2022-03-15 NOTE — Progress Notes (Signed)
Patient was discharged and transported to [facility]  via [place],  at 2110. Pt  is alert but confused.  VSS.  Denies any pain.

## 2022-03-15 NOTE — Progress Notes (Signed)
Pt confused yet pleasant. AVS included in packet prepared by SW. Family aware pt being transported today to [facility] for rehab. Still awaiting PTAR to transport pt. Night shift RN aware.

## 2022-03-15 NOTE — TOC Transition Note (Signed)
Transition of Care [facility]) - CM/SW Discharge Note  Patient Details  Name: [name] MRN: [id number] Date of Birth: [birth year]  Transition of Care [facility]) CM/SW Contact:  Sherie Don, LCSW Phone Number: 03/15/2022, 10:47 AM  Clinical Narrative: Patient is stable for discharge to SNF and [facility] can accept the patient today. Patient will go to room 405 and the number for report is [phone]. Discharge summary, discharge orders, and SNF transfer report faxed to facility in hub. Medical necessity form done; PTAR scheduled. Discharge packet completed. Daughter notified of discharge. RN updated. TOC signing off.  Final next level of care: Skilled Nursing Facility Barriers to Discharge: Barriers Resolved  Patient Goals and CMS Choice Patient states their goals for this hospitalization and ongoing recovery are:: Go to short-term rehab CMS Medicare.gov Compare Post Acute Care list provided to:: Patient Represenative (must comment) Choice offered to / list presented to : Adult Children  Discharge Placement PASRR number recieved: 03/13/22      Patient chooses bed at: [facility] Patient to be transferred to facility by: [place] Name of family member notified: Laurin Coder (daughter) Patient and family notified of of transfer: 03/15/22  Discharge Plan and Services In-house Referral: Clinical Social Work Post Acute Care Choice: [place]          DME Arranged: N/A DME Agency: NA  Readmission Risk Interventions     View : No data to display.

## 2022-03-15 NOTE — Discharge Summary (Addendum)
Physician Discharge Summary   Patient: [name] MRN: [id number] DOB: 1929/11/06  Admit date:     03/12/2022  Discharge date: 03/15/22  Discharge Physician: Meredeth IdeGagan S Nikyah Lackman   PCP: Kaleen MaskElkins, Wilson Oliver, MD   Recommendations at discharge:   Patient to be discharged to skilled nursing facility Consider endovaginal ultrasound as outpatient after pessary removal   Discharge Diagnoses: Principal Problem:   Physical deconditioning Active Problems:   Essential hypertension   Mixed hyperlipidemia   Dementia without behavioral disturbance (HCC)   GERD without esophagitis   Hypokalemia   UTI (urinary tract infection)   SIRS (systemic inflammatory response syndrome) (HCC)   Abnormal collection of fluid in uterine cavity  Resolved Problems:   * No resolved hospital problems. *  Hospital Course: 88 y.o. female with medical history significant for dementia, hypertension, hyperlipidemia, GERD, and recent admission for UTI with acute encephalopathy, now presenting to the emergency department with general weakness.  Patient was admitted to the hospital on 03/09/2022 with UTI and acute encephalopathy.  She was requiring intensive assistance with all aspects of her mobility but family wanted the patient back home and she was discharged home with home health earlier today.  Patient's daughter [name](POA) brought her home from the hospital but the patient required 2 person assist to get inside the house, was unable to ambulate independently like she had been prior to the recent hospitalization, and family had her sent back to the hospital for placement in a rehab facility.  Assessment and Plan:  Physical deconditioning -Patient was discharged from the hospital yesterday after she was treated for UTI with acute encephalopathy -She was sent back to the hospital as she was too weak to get inside the house without assistance from 2 children -PT was consulted -Patient to go to skilled nursing facility    SIRS -Patient presented with leukocytosis and mild tachycardia -Denies fever, hypotension, also had increased lactate -Blood and urine culture obtained in the ED -Patient started on LR at 30 cc/kg -Patient does not appear to be septic -She was treated with Rocephin for UTI in hospital starting 5/19, blood cultures were negative from 5/19 and urine culture grew multiple species -Blood cultures negative to date, urine culture showed no growth -Patient was started on empiric antibiotic including cefepime -We will discontinue cefepime at this time. -WBC is down to 10,000   Hypertension -Blood pressure is elevated -Continue lisinopril; will add amlodipine 10 mg daily -Continue as needed hydralazine   Hypokalemia -Replete   Dementia -At baseline -No behavior disturbance   Abnormal fluid collection in the uterine fundus -Noted incidentally on CT chest abdomen/pelvis on 03/12/2022 -Transabdominal ultrasound obtained which did not show any fluid collection -Consider endovaginal ultrasound as outpatient after pessary removal   Chronic left hydronephrosis and hydroureter -Seen on CT abdomen/pelvis -Likely secondary to severe pelvic floor relaxation and large cystocele, rectocele and uterine prolapse -Renal function is normal -Follow-up urology as outpatient            Consultants:  Procedures performed:  Disposition: Skilled nursing facility Diet recommendation:  Discharge Diet Orders (From admission, onward)     Start     Ordered   03/15/22 0000  Diet - low sodium heart healthy        03/15/22 0940           Regular diet DISCHARGE MEDICATION: Allergies as of 03/15/2022   No Known Allergies      Medication List     STOP taking these medications  cephALEXin 500 MG capsule Commonly known as: KEFLEX       TAKE these medications    acetaminophen 500 MG tablet Commonly known as: TYLENOL Take 500 mg by mouth See admin instructions. Take 500 mg by mouth  in the morning and an additional 500 mg once a day as needed for pain   amLODipine 10 MG tablet Commonly known as: NORVASC Take 1 tablet (10 mg total) by mouth daily. Start taking on: Mar 16, 2022   feeding supplement Liqd Take 237 mLs by mouth 2 (two) times daily between meals.   lisinopril 10 MG tablet Commonly known as: ZESTRIL Take 10 mg by mouth at bedtime.   senna-docusate 8.6-50 MG tablet Commonly known as: Senokot-S Take 1 tablet by mouth at bedtime as needed for mild constipation.   traMADol 50 MG tablet Commonly known as: ULTRAM Take 1 tablet (50 mg total) by mouth every other day as needed (arthritis pain.). Take 50 mg by mouth in the morning and an additional 50 mg once a day as needed for pain        Contact information for after-discharge care     Destination     [facility] .   Service: Skilled Nursing Contact information: [address] [place] [address] [phone]                    Discharge Exam: Ceasar Mons Weights   03/13/22 0000  Weight: 41.8 kg   General-appears in no acute distress Heart-S1-S2, regular, no murmur auscultated Lungs-clear to auscultation bilaterally, no wheezing or crackles auscultated Abdomen-soft, nontender, no organomegaly Extremities-no edema in the lower extremities Neuro-alert, oriented x3, no focal deficit noted  Condition at discharge: fair  The results of significant diagnostics from this hospitalization (including imaging, microbiology, ancillary and laboratory) are listed below for reference.   Imaging Studies: DG Chest 2 View  Result Date: 03/09/2022 CLINICAL DATA:  Weakness dysuria EXAM: CHEST - 2 VIEW COMPARISON:  01/24/2020 FINDINGS: Cardiac shadow is at the upper limits of in size. The lungs are well aerated bilaterally. No focal infiltrate or effusion is seen. No acute bony abnormality is noted. IMPRESSION: No active cardiopulmonary disease. Electronically Signed    By: Alcide Clever M.D.   On: 03/09/2022 23:48   CT HEAD WO CONTRAST ( )  Result Date: 03/12/2022 CLINICAL DATA:  Altered mental status. EXAM: CT HEAD WITHOUT CONTRAST TECHNIQUE: Contiguous axial images were obtained from the base of the skull through the vertex without intravenous contrast. RADIATION DOSE REDUCTION: This exam was performed according to the departmental dose-optimization program which includes automated exposure control, adjustment of the mA and/or kV according to patient size and/or use of iterative reconstruction technique. COMPARISON:  Head CT dated 03/09/2022. FINDINGS: Brain: Moderate age-related atrophy and chronic microvascular ischemic changes. There is no acute intracranial hemorrhage. No mass effect or midline shift. No extra-axial fluid collection. Vascular: No hyperdense vessel or unexpected calcification. Skull: Normal. Negative for fracture or focal lesion. Sinuses/Orbits: No acute finding. Other: None IMPRESSION: 1. No acute intracranial pathology. 2. Moderate age-related atrophy and chronic microvascular ischemic changes. Electronically Signed   By: Elgie Collard M.D.   On: 03/12/2022 19:46   CT Head Wo Contrast  Result Date: 03/09/2022 CLINICAL DATA:  Mental status change, unknown cause EXAM: CT HEAD WITHOUT CONTRAST TECHNIQUE: Contiguous axial images were obtained from the base of the skull through the vertex without intravenous contrast. RADIATION DOSE REDUCTION: This exam was performed according to the departmental  dose-optimization program which includes automated exposure control, adjustment of the mA and/or kV according to patient size and/or use of iterative reconstruction technique. COMPARISON:  11/17/2019 FINDINGS: Brain: Normal anatomic configuration. Parenchymal volume loss is commensurate with the patient's age. Mild periventricular white matter changes are present likely reflecting the sequela of small vessel ischemia. Remote lacunar infarcts within the  inferior basal ganglia bilaterally are unchanged. No abnormal intra or extra-axial mass lesion or fluid collection. No abnormal mass effect or midline shift. No evidence of acute intracranial hemorrhage or infarct. Ventricular size is normal. Cerebellum unremarkable. Vascular: No asymmetric hyperdense vasculature at the skull base. Skull: Intact Sinuses/Orbits: Paranasal sinuses are clear. Orbits are unremarkable. Other: Mastoid air cells and middle ear cavities are clear. IMPRESSION: No acute intracranial abnormality. Stable senescent change and remote lacunar infarcts. Electronically Signed   By: Helyn Numbers M.D.   On: 03/09/2022 23:57   US Pelvis Complete  Result Date: 03/12/2022 CLINICAL DATA:  Abnormal uterus on abdominal CT EXAM: TRANSABDOMINAL ULTRASOUND OF PELVIS TECHNIQUE: Transabdominal ultrasound examination of the pelvis was performed including evaluation of the uterus, ovaries, adnexal regions, and pelvic cul-de-sac. COMPARISON:  03/12/2022, 01/19/2014 FINDINGS: Uterus The uterus is not well demonstrated due to marked atrophy. Right ovary Not visualized. Left ovary Not visualized. Other findings: The structure measured by the sonographer within the lower pelvis is actually the pessary within the vaginal cavity. The bladder was not fully distended at the time of the exam. Endovaginal exam was deferred. The rim enhancing fluid collection within the region of the left adnexa on CT is not identified by ultrasound. IMPRESSION: 1. Essentially nondiagnostic evaluation of the pelvis as above. 2. The rim enhancing fluid collection seen on prior CT within the left adnexa is not identified by transabdominal pelvic ultrasound. Further evaluation with endovaginal pelvic ultrasound could be considered after pessary removal. Electronically Signed   By: Sharlet Salina M.D.   On: 03/12/2022 21:00   CT CHEST ABDOMEN PELVIS W CONTRAST  Result Date: 03/12/2022 CLINICAL DATA:  Patient reports from home with  weakness. Recently discharged from hospital after UTI. EXAM: CT CHEST, ABDOMEN, AND PELVIS WITH CONTRAST TECHNIQUE: Multidetector CT imaging of the chest, abdomen and pelvis was performed following the standard protocol during bolus administration of intravenous contrast. RADIATION DOSE REDUCTION: This exam was performed according to the departmental dose-optimization program which includes automated exposure control, adjustment of the mA and/or kV according to patient size and/or use of iterative reconstruction technique. CONTRAST:  29mL OMNIPAQUE IOHEXOL 300 MG/ML  SOLN COMPARISON:  CT AP 01/19/2014 FINDINGS: CT CHEST FINDINGS Cardiovascular: Heart size is within normal limits. Aortic atherosclerosis. Coronary artery atherosclerotic calcifications. No pericardial effusion. Mediastinum/Nodes: No enlarged mediastinal, hilar, or axillary lymph nodes. Thyroid gland, trachea, and esophagus demonstrate no significant findings. Lungs/Pleura: Mild dependent changes with pleural thickening overlying the posterior lung bases. No pleural effusion, interstitial edema, or airspace consolidation. Centrilobular emphysema. Musculoskeletal: The bones appear diffusely osteopenic. There is a chronic moderate to severe compression fracture involving the T12 vertebral body which is a unchanged from 11/17/2019. The no acute or suspicious osseous findings. CT ABDOMEN PELVIS FINDINGS Hepatobiliary: No suspicious liver lesion. Tiny stones and/or sludge noted within the dependent portion of the gallbladder. No gallbladder wall thickening or signs of bile duct dilatation. Pancreas: Unremarkable. No pancreatic ductal dilatation or surrounding inflammatory changes. Spleen: Normal in size without focal abnormality. Adrenals/Urinary Tract:  Normal appearance of the adrenal glands. The right kidney appears within normal limits. There is chronic parenchymal volume loss  and scarring involving the upper and lower pole of the left kidney.  Calcification noted within the posterior cortex of the left kidney measuring 3 mm, image 57/2. Mild chronic left-sided hydronephrosis is identified. The left ureter appears chronically dilated without signs of obstructing stone or mass. This appears similar to the previous exam. Urinary bladder is unremarkable. Stomach/Bowel: Stomach appears normal. The appendix is visualized and is normal. No bowel wall thickening, inflammation, or distension. Vascular/Lymphatic: Aortic atherosclerosis. No aneurysm. No signs of abdominopelvic adenopathy. Reproductive: A pessary device is in place. The uterus appears atrophic. There is a peripherally enhancing round fluid collection within the uterine fundus which measures 3.2 x 3.1 cm, image 89/2. Not seen on previous exam. Other: No free fluid or fluid collections. Musculoskeletal: Diffuse osteopenia. Treated compression fracture is identified at L1. Unchanged appearance of severe compression fracture involving the L2 vertebral body. New mild superior endplate fracture deformity is noted at L4. This is age indeterminate. IMPRESSION: 1. No acute findings identified within the chest, abdomen or pelvis to explain patient's weakness. 2. Mild atrophy with multifocal areas of cortical scarring noted involving the left kidney. 3. Chronic left hydronephrosis and hydroureter. Previously reported as likely secondary to severe pelvic floor relaxation and large cystocele, rectocele an uterine prolapse. 4. New peripherally enhancing fluid collection within the uterine fundus is identified of uncertain significance. Consider more definitive characterization with pelvic sonogram. 5. Chronic compression fractures are noted involving T12, L1 and L2. There is a new, age indeterminate mild superior endplate compression deformity involving the L4 vertebral body compared with 01/19/2014. Electronically Signed   By: Signa Kell M.D.   On: 03/12/2022 20:00   DG Chest Portable 1 View  Result Date:  03/12/2022 CLINICAL DATA:  Weakness and fatigue.  Current smoker. EXAM: PORTABLE CHEST 1 VIEW COMPARISON:  03/09/2022 FINDINGS: Shallow inspiration. Normal heart size and pulmonary vascularity. No focal airspace disease or consolidation in the lungs. No blunting of costophrenic angles. No pneumothorax. Mediastinal contours appear intact. Calcification of the aorta. Degenerative changes in the spine and shoulders. IMPRESSION: No active disease. Electronically Signed   By: Burman Nieves M.D.   On: 03/12/2022 18:16    Microbiology: Results for orders placed or performed during the hospital encounter of 03/12/22  Urine Culture     Status: None   Collection Time: 03/12/22  7:28 PM   Specimen: Urine, Clean Catch  Result Value Ref Range Status   Specimen Description   Final    URINE, CLEAN CATCH Performed at [facility], [address], [place], [place] [address]    Special Requests   Final    NONE Performed at [facility], [address], [place], [place] [address]    Culture   Final    NO GROWTH Performed at [facility], [address] [place], [place] [address]    Report Status 03/13/2022 FINAL  Final  Blood culture (routine x 2)     Status: None (Preliminary result)   Collection Time: 03/12/22  7:41 PM   Specimen: BLOOD  Result Value Ref Range Status   Specimen Description   Final    BLOOD BLOOD LEFT FOREARM Performed at [facility], [address], [place], [place] [address]    Special Requests   Final    BOTTLES DRAWN AEROBIC AND ANAEROBIC Blood Culture results may not be optimal due to an excessive volume of blood received in culture bottles Performed at [facility], [address] [place], [place] [address]    Culture   Final    NO GROWTH 2 DAYS Performed at [facility], [address], [place], [place] [address]    Report Status PENDING  Incomplete  Blood culture (routine x 2)      Status: None (Preliminary result)   Collection Time: 03/13/22 12:33 AM   Specimen: BLOOD  Result Value Ref Range Status   Specimen Description   Final    BLOOD BLOOD RIGHT HAND Performed at [facility], [address], [place], [place] [address]    Special Requests   Final    BOTTLES DRAWN AEROBIC ONLY Blood Culture results may not be optimal due to an inadequate volume of blood received in culture bottles Performed at [facility], [address] [place], [place] [address]    Culture   Final    NO GROWTH 1 DAY Performed at [facility], [address], [place], [place] [address]    Report Status PENDING  Incomplete    Labs: CBC: Recent Labs  Lab 03/10/22 0020 03/10/22 0901 03/12/22 0411 03/12/22 1744 03/13/22 0033 03/14/22 0418  WBC 11.9* 10.6* 12.6* 15.3* 13.8* 10.1  NEUTROABS 9.9* 8.7* 10.3* 13.9*  --   --   HGB 12.7 12.5 12.4 11.5* 11.4* 10.9*  HCT 38.7 37.3 36.4 34.4* 33.6* 32.7*  MCV 92.6 90.8 89.4 90.5 92.1 91.3  PLT 179 174 195 211 182 197   Basic Metabolic Panel: Recent Labs  Lab 03/10/22 0020 03/10/22 0901 03/12/22 0411 03/12/22 1744 03/13/22 0033 03/14/22 0418  NA 136 138 136 137 134* 140  K 3.5 3.0* 3.5 3.4* 3.1* 4.0  CL 102 102 103 102 102 106  CO2 GLUCOSE 131* 119* 135* 175* 130* 126*  BUN 25* 18 22  CREATININE 0.71 0.68 0.81 0.85 0.69 0.69  CALCIUM 9.3 9.3 9.1 9.2 8.4* 9.1  MG 1.9 1.8  --  2.1 1.8  --    Liver Function Tests: Recent Labs  Lab 03/10/22 0020 03/10/22 0901 03/12/22 1744 03/14/22 0418  AST 40  ALT 37  ALKPHOS 76 75 72 75  BILITOT 1.4* 0.8 1.1 0.9  PROT 7.5 7.4 7.6 6.7  ALBUMIN 3.9 3.7 3.4* 2.9*   CBG: No results for input(s): GLUCAP in the last 168 hours.  Discharge time spent: greater than 30 minutes.  Signed: Meredeth Ide, MD [facility] 03/15/2022

## 2022-03-15 NOTE — Progress Notes (Signed)
Physical Therapy Treatment Patient Details Name: [name] MRN: [id number] DOB: Jul 17, 1930 Today's Date: 03/15/2022   History of Present Illness Pt is a 88yo female presenting to [facility] ED on 5/22 secondary to general weakness. Pt had recent admit to [facility] on 5/19 secondary to UTI with acute encephalopathy and was discharged 5/22 and per H&P pt's family was unable to assist pt inside the house and returned her to ED. Imaging revealed chronic compression fxs T12-L2 and age-indeterminate compression fx L4.  PMH: dementia, HTN, HLD, GERD, cardiomegaly, kyphoplasty 2015,    PT Comments    Pt reports increased pain in R hip (5/10) and fatigue today, refused transfer to chair but is willing to bed-level exercises. Pt min assist for rolling in bed with multimodal cues for use bed rails, was able to assist in scooting up in bed to change position. Pt completed general BUE and BLE exercises with cuing and several rest breaks. Encouraged pt to complete AROM exercises often outside of PT session, pt verbalized understanding. Discharge destination remains appropriate given functional limitations; we will continue to follow her acutely.     Recommendations for follow up therapy are one component of a multi-disciplinary discharge planning process, led by the attending physician.  Recommendations may be updated based on patient status, additional functional criteria and insurance authorization.  Follow Up Recommendations  Skilled nursing-short term rehab (<3 hours/day)     Assistance Recommended at Discharge Frequent or constant Supervision/Assistance  Patient can return home with the following A lot of help with walking and/or transfers;A lot of help with bathing/dressing/bathroom;Assistance with cooking/housework;Direct supervision/assist for medications management;Direct supervision/assist for financial management;Assist for transportation;Help with stairs or ramp for entrance   Equipment Recommendations  None  recommended by PT (TBD at SNF)    Recommendations for Other Services       Precautions / Restrictions Precautions Precautions: Fall Precaution Comments: pt reports clumsiness Restrictions Weight Bearing Restrictions: No     Mobility  Bed Mobility   Bed Mobility: Rolling Rolling: Min assist         General bed mobility comments: Pt min assist for rolling R & L and scooting up in bed to place pt in more comfortable position, declined further bed mobility and transfers, reporting fatigue and hip pain.    Transfers                        Ambulation/Gait         Gait velocity: Decreased     General Gait Details: Unsafe to progress to gait training this date secondary to pt's generalized weakness.   Stairs             Wheelchair Mobility    Modified Rankin (Stroke Patients Only)       Balance                                            Cognition Arousal/Alertness: Awake/alert Behavior During Therapy: Flat affect, WFL for tasks assessed/performed Overall Cognitive Status: No family/caregiver present to determine baseline cognitive functioning                                 General Comments: dementia at baseline. Pt is able to follow one-step commands consistently. Pt AOx4  Exercises General Exercises - Upper Extremity Shoulder Flexion: AROM, Both, 10 reps, Supine Elbow Flexion: AROM, Both, 10 reps, Supine Elbow Extension: AROM, Both, 10 reps, Supine General Exercises - Lower Extremity Ankle Circles/Pumps: AROM, Both, 10 reps, Supine Quad Sets: AROM, Both, 10 reps, Supine Short Arc Quad: AROM, 10 reps, Supine, Both Heel Slides: AROM, Both, 10 reps, Supine Straight Leg Raises: AROM, 10 reps, Both, Supine    General Comments        Pertinent Vitals/Pain Pain Assessment Pain Assessment: 0-10 Pain Score: 5  Pain Location: right hip Pain Descriptors / Indicators: Dull, Discomfort Pain  Intervention(s): Limited activity within patient's tolerance, Monitored during session, Repositioned    Home Living                          Prior Function            PT Goals (current goals can now be found in the care plan section) Acute Rehab PT Goals Patient Stated Goal: Home today PT Goal Formulation: With patient Time For Goal Achievement: 03/27/22 Potential to Achieve Goals: Fair Progress towards PT goals: Not progressing toward goals - comment (Weak and deconditioned)    Frequency    Min 2X/week      PT Plan Current plan remains appropriate    Co-evaluation              AM-PAC PT "6 Clicks" Mobility   Outcome Measure  Help needed turning from your back to your side while in a flat bed without using bedrails?: A Lot Help needed moving from lying on your back to sitting on the side of a flat bed without using bedrails?: A Lot Help needed moving to and from a bed to a chair (including a wheelchair)?: Total Help needed standing up from a chair using your arms (e.g., wheelchair or bedside chair)?: Total Help needed to walk in hospital room?: Total Help needed climbing 3-5 steps with a railing? : Total 6 Click Score: 8    End of Session   Activity Tolerance: Patient limited by fatigue (weakness) Patient left: in bed;with bed alarm set;with call bell/phone within reach Nurse Communication: Mobility status PT Visit Diagnosis: Difficulty in walking, not elsewhere classified (R26.2);Muscle weakness (generalized) (M62.81);Unsteadiness on feet (R26.81)     Time: 4481-8563 PT Time Calculation (min) (ACUTE ONLY): 19 min  Charges:  $Therapeutic Exercise: 8-22 mins                     Jamesetta Geralds, PT, DPT [facility] Department Office: [phone] Pager: [phone]   Jamesetta Geralds 03/15/2022, 10:11 AM

## 2022-03-15 NOTE — Progress Notes (Signed)
Report called and given to Nurse Amme @[facility] . Awaiting [facility] arrival to transport pt to facility.

## 2022-03-17 LAB — CULTURE, BLOOD (ROUTINE X 2): Culture: NO GROWTH

## 2022-03-18 LAB — CULTURE, BLOOD (ROUTINE X 2): Culture: NO GROWTH

## 2022-04-09 ENCOUNTER — Encounter (HOSPITAL_COMMUNITY): Payer: Self-pay

## 2022-04-09 ENCOUNTER — Emergency Department (HOSPITAL_COMMUNITY)
Admission: EM | Admit: 2022-04-09 | Discharge: 2022-04-09 | Disposition: A | Discharge status: Home / Self Care | Payer: Medicare Other | Attending: Emergency Medicine | Admitting: Emergency Medicine

## 2022-04-09 DIAGNOSIS — M549 Dorsalgia, unspecified: Secondary | ICD-10-CM | POA: Diagnosis present

## 2022-04-09 DIAGNOSIS — F0390 Unspecified dementia without behavioral disturbance: Secondary | ICD-10-CM | POA: Diagnosis not present

## 2022-04-09 DIAGNOSIS — M545 Low back pain, unspecified: Secondary | ICD-10-CM | POA: Insufficient documentation

## 2022-04-09 DIAGNOSIS — Z79899 Other long term (current) drug therapy: Secondary | ICD-10-CM | POA: Diagnosis not present

## 2022-04-09 MED ORDER — ACETAMINOPHEN 500 MG PO TABS: 1000.0000 mg | ORAL_TABLET | Freq: Once | ORAL | Status: DC

## 2022-04-09 NOTE — ED Provider Notes (Signed)
[facility]-EMERGENCY DEPT Provider Note   CSN: [id number] Arrival date & time: 04/09/22  1104     History  Chief Complaint  Patient presents with   Back Pain    [name] is a 88 y.o. female.  HPI Patient with dementia presents with concern for back pain.  Via EMS.  History is obtained by those individuals, according to family patient has chronic back pain.  Patient is an inconsistent historian at best.  Initially she states that she is unsure why she is here, when asked specifically about back pain she states that she always has back pain.  Seemingly no recent fall, fever.  Patient denies any current complaints, states that she is hungry.    Home Medications Prior to Admission medications   Medication Sig Start Date End Date Taking? Authorizing Provider  acetaminophen (TYLENOL) 500 MG tablet Take 500 mg by mouth See admin instructions. Take 500 mg by mouth in the morning and an additional 500 mg once a day as needed for pain    [provider]  amLODipine (NORVASC) 10 MG tablet Take 1 tablet (10 mg total) by mouth daily. 03/16/22   Meredeth Ide, MD  feeding supplement (ENSURE ENLIVE / ENSURE PLUS) LIQD Take 237 mLs by mouth 2 (two) times daily between meals. 03/15/22   Meredeth Ide, MD  lisinopril (ZESTRIL) 10 MG tablet Take 10 mg by mouth at bedtime.     [provider]  senna-docusate (SENOKOT-S) 8.6-50 MG tablet Take 1 tablet by mouth at bedtime as needed for mild constipation. 03/15/22   Meredeth Ide, MD  traMADol (ULTRAM) 50 MG tablet Take 1 tablet (50 mg total) by mouth every other day as needed (arthritis pain.). Take 50 mg by mouth in the morning and an additional 50 mg once a day as needed for pain 03/15/22   Meredeth Ide, MD      Allergies    Patient has no known allergies.    Review of Systems   Review of Systems  Unable to perform ROS: Dementia    Physical Exam Updated Vital Signs BP (!) 159/64 (BP Location: Left Arm)    Pulse 98   Temp 98.1 F (36.7 C)   Resp 20   SpO2 98%  Physical Exam Vitals and nursing note reviewed.  Constitutional:      General: She is not in acute distress.    Appearance: She is well-developed.  HENT:     Head: Normocephalic and atraumatic.  Eyes:     Conjunctiva/sclera: Conjunctivae normal.  Cardiovascular:     Rate and Rhythm: Normal rate and regular rhythm.  Pulmonary:     Effort: Pulmonary effort is normal. No respiratory distress.     Breath sounds: Normal breath sounds. No stridor.  Abdominal:     General: There is no distension.  Musculoskeletal:     Comments: Palpate of the back without obvious abnormality, no guarding, tenderness, deformity, crepitus step-off. Patient flexes each hip independently, spontaneously, frequently no apparent discomfort in the back  Skin:    General: Skin is warm and dry.  Neurological:     Mental Status: She is alert.     Cranial Nerves: No cranial nerve deficit.     Motor: Atrophy present. No tremor or abnormal muscle tone.  Psychiatric:        Mood and Affect: Mood normal.        Cognition and Memory: Cognition is impaired. Memory is impaired.  ED Results / Procedures / Treatments   Labs (all labs ordered are listed, but only abnormal results are displayed) Labs Reviewed - No data to display   EKG None  Radiology No results found.  Procedures Procedures    Medications Ordered in ED Medications  acetaminophen (TYLENOL) tablet 1,000 mg (has no administration in time range)    ED Course/ Medical Decision Making/ A&P This patient with a Hx of dementia and back pain presents to the ED for concern of back pain, this involves an extensive number of treatment options, and is a complaint that carries with it a high risk of complications and morbidity.    The differential diagnosis includes acute on chronic back pain versus new  Phenomenon including possible fall, fracture, or genitourinary disease, less likely GI  given the absence of abdominal pain, reports of vomiting, diarrhea   Social Determinants of Health:  Age, dementia  Additional history obtained:  Additional history and/or information obtained from EMS, notable for HPI as above   After the initial evaluation, orders, including:  Tylenol were initiated.   1530: I discussed the patient's presentation with her son.  He notes that since discharge from rehab after admission for sepsis she has been living at home.  Today she complained of back pain, requesting transfer.  No report of new fever, additional fall, vomiting, other concerns.  We discussed today's reassuring physical exam, patient's son is amenable to discharge with outpatient follow-up.   On repeat evaluation of the patient improved 4:02 PM Patient ambulatory, with a nurse tech, and myself providing minimal assistance, stable, doing a jig of sorts=  Dispostion / Final MDM:  After consideration of the diagnostic results and the patient's response to treatment, adult female with recent hospitalization for sepsis, with known history of dementia presents with possible back pain.  By the time of arrival patient does not complain of anything, is moving all extremities spontaneously, is awake, alert for hours without complaints.  Patient is ambulatory, has no fever, unremarkable vital signs, after discussion with family members was discharged in stable condition.  Final Clinical Impression(s) / ED Diagnoses Final diagnoses:  Low back pain, unspecified back pain laterality, unspecified chronicity, unspecified whether sciatica present     Gerhard Munch, MD 04/09/22 [phone]

## 2022-04-09 NOTE — ED Notes (Signed)
[facility] called for transport.  

## 2022-04-09 NOTE — ED Triage Notes (Signed)
Pt arrived via EMS, from home, c/o low back pain, per EMS, family states issue is chronic. Hx of dementia.

## 2022-04-09 NOTE — ED Provider Triage Note (Signed)
Emergency Medicine Provider Triage Evaluation Note  [name] , a 88 y.o. female  was evaluated in triage.  Pt complains of chronic low back pain. Hx of dementia, family reports no change recently. Hx of L1 vertebral fracture  Review of Systems  Positive: Back pain Negative: fever  Physical Exam  BP 138/76 (BP Location: Left Arm)   Pulse 96   Temp 98.1 F (36.7 C)   Resp 18   SpO2 98%  Gen:   Awake, no distress   Resp:  Normal effort  MSK:   Moves extremities without difficulty  Other:    Medical Decision Making  Medically screening exam initiated at 11:22 AM.  Appropriate orders placed.  [name] was informed that the remainder of the evaluation will be completed by another provider, this initial triage assessment does not replace that evaluation, and the importance of remaining in the ED until their evaluation is complete.     [name] T, PA-C 04/09/22 1123

## 2022-04-09 NOTE — Discharge Instructions (Signed)
As discussed, your evaluation today has been largely reassuring.  But, it is important that you monitor your condition carefully, and do not hesitate to return to the ED if you develop new, or concerning changes in your condition. ? ?Otherwise, please follow-up with your physician for appropriate ongoing care. ? ?

## 2022-12-21 DEATH — deceased
